# Patient Record
Sex: Female | Born: 1985 | Race: White | Hispanic: No | Marital: Married | Smoking: Never smoker
Health system: Southern US, Community
[De-identification: ages and names within clinical notes are randomized; demographics above are authoritative.]

## PROBLEM LIST (undated history)

## (undated) DIAGNOSIS — F411 Generalized anxiety disorder: Secondary | ICD-10-CM

## (undated) DIAGNOSIS — E039 Hypothyroidism, unspecified: Secondary | ICD-10-CM

## (undated) DIAGNOSIS — G43829 Menstrual migraine, not intractable, without status migrainosus: Secondary | ICD-10-CM

## (undated) DIAGNOSIS — M797 Fibromyalgia: Secondary | ICD-10-CM

## (undated) DIAGNOSIS — R51 Headache: Secondary | ICD-10-CM

## (undated) DIAGNOSIS — Z309 Encounter for contraceptive management, unspecified: Secondary | ICD-10-CM

## (undated) HISTORY — DX: Encounter for contraceptive management, unspecified: Z30.9

## (undated) HISTORY — DX: Menstrual migraine, not intractable, without status migrainosus: G43.829

## (undated) HISTORY — DX: Hypothyroidism, unspecified: E03.9

## (undated) HISTORY — DX: Headache: R51

## (undated) HISTORY — PX: WISDOM TOOTH EXTRACTION: SHX21

## (undated) HISTORY — DX: Fibromyalgia: M79.7

## (undated) HISTORY — DX: Generalized anxiety disorder: F41.1

## (undated) HISTORY — PX: OTHER SURGICAL HISTORY: SHX169

---
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
## 2005-05-28 IMAGING — NM NM HEPATO W/GB/PHARM/[PERSON_NAME]
2 series · 12 of 12 positions shown · non-contrast
Comparison: none

CLINICAL DATA: Abdominal pain.
 HEPATOBILIARY SCAN WITH EJECTION FRACTION 
 Hepatobiliary imaging performed following 5 mCi Zc-ZZm mebrofenin.  Prompt tracer extraction from blood stream, indicating normal hepatocellular function.  Prompt excretion into the biliary tree with visualization of the gallbladder by 15 minutes and small bowel by 30 minutes.  No hepatic retention of tracer.  Progressive filling of gallbladder over one hour.
 At one hour, the patient ingested Half and Half and imaging was continued for 60 minutes.  Prompt emptying of tracer occurs from the gallbladder following fatty meal stimulation.  Calculated ejection fraction is 93% which is normal.
 IMPRESSION
 Normal exam.

[Series 1: hepatobiliary · 3.20mm/px · 6 of 60 frames shown (1 of 2)]
[frame 6/60]
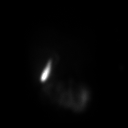
[frame 16/60]
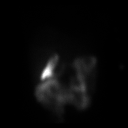
[frame 26/60]
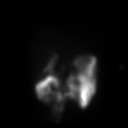
[frame 36/60]
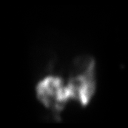
[frame 46/60]
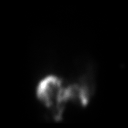
[frame 56/60]
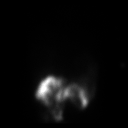

[Series 1: hepatobiliary · 3.20mm/px · 6 of 60 frames shown (2 of 2)]
[frame 6/60]
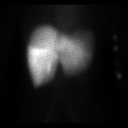
[frame 16/60]
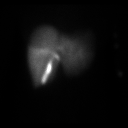
[frame 26/60]
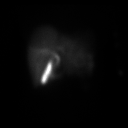
[frame 36/60]
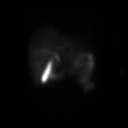
[frame 46/60]
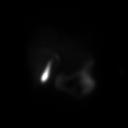
[frame 56/60]
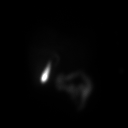

[12 of 12 positions shown; findings below may reference images not displayed]

## 2007-07-24 ENCOUNTER — Other Ambulatory Visit: Admission: RE | Admit: 2007-07-24 | Discharge: 2007-07-24 | Payer: Self-pay | Admitting: Obstetrics and Gynecology

## 2008-08-26 ENCOUNTER — Other Ambulatory Visit: Admission: RE | Admit: 2008-08-26 | Discharge: 2008-08-26 | Payer: Self-pay | Admitting: Obstetrics and Gynecology

## 2009-02-14 ENCOUNTER — Emergency Department (HOSPITAL_COMMUNITY): Admission: EM | Admit: 2009-02-14 | Discharge: 2009-02-14 | Payer: Self-pay | Admitting: Emergency Medicine

## 2009-10-08 ENCOUNTER — Other Ambulatory Visit: Admission: RE | Admit: 2009-10-08 | Discharge: 2009-10-08 | Payer: Self-pay | Admitting: Obstetrics and Gynecology

## 2010-11-08 ENCOUNTER — Encounter: Payer: Self-pay | Admitting: Pulmonary Disease

## 2011-02-01 ENCOUNTER — Other Ambulatory Visit (HOSPITAL_COMMUNITY)
Admission: RE | Admit: 2011-02-01 | Discharge: 2011-02-01 | Disposition: A | Discharge status: Home / Self Care | Payer: BC Managed Care – PPO | Source: Ambulatory Visit | Attending: Obstetrics and Gynecology | Admitting: Obstetrics and Gynecology

## 2011-02-01 DIAGNOSIS — Z113 Encounter for screening for infections with a predominantly sexual mode of transmission: Secondary | ICD-10-CM | POA: Insufficient documentation

## 2011-02-01 DIAGNOSIS — Z01419 Encounter for gynecological examination (general) (routine) without abnormal findings: Secondary | ICD-10-CM | POA: Insufficient documentation

## 2011-03-05 NOTE — Group Therapy Note (Signed)
   NAME:  Erica Farley, Erica Farley                      ACCOUNT NO.:  [id number]   MEDICAL RECORD NO.:  [id number]                   PATIENT TYPE:  OBV   LOCATION:  A316                                 FACILITY:  [facility]   PHYSICIAN:  Edward L. Juanetta Gosling, M.D.             DATE OF BIRTH:  [birth year]   DATE OF PROCEDURE:  01/29/2003  DATE OF DISCHARGE:                                   PROGRESS NOTE   PROBLEM:  Nausea, vomiting, and diarrhea.   SUBJECTIVE:  The patient says she is feeling much better this morning.  She  has no new complaints.   OBJECTIVE:  Her white count is down to the 6000 range.  Her abdomen is soft.  She has no tenderness.   ASSESSMENT:  She is much improved.   PLAN:  Discharge today; please see Discharge Summary for details.                                               Edward L. Juanetta Gosling, M.D.    ELH/MEDQ  D:  01/29/2003  T:  01/29/2003  Job:  [id number]

## 2011-03-05 NOTE — H&P (Signed)
NAME:  Erica Farley, Erica Farley                      ACCOUNT NO.:  [id number]   MEDICAL RECORD NO.:  [id number]                   PATIENT TYPE:  EMS   LOCATION:  ED                                   FACILITY:  [facility]   PHYSICIAN:  Edward L. Juanetta Gosling, M.D.             DATE OF BIRTH:  1986-01-09   DATE OF ADMISSION:  01/28/2003  DATE OF DISCHARGE:                                HISTORY & PHYSICAL   CHIEF COMPLAINT:  Nausea, vomiting and diarrhea.   HISTORY OF PRESENT ILLNESS:  The patient awoke about midnight on the morning  of admission with nausea and vomiting.  She vomited on several occasions  with fairly marked diarrhea as well and eventually came to the emergency  room for evaluation.  In the emergency room, she was felt to be somewhat  dehydrated and was found to have a white blood cell count of 19,000.  She  underwent a number of studies, none of which showed a definite abnormality  that explained her problem.  She did have a CT scan and although her  appendix was note definitely seen, there was no evidence of inflammation.   PAST MEDICAL HISTORY:  Negative.   MEDICATIONS:  None.   ALLERGIES:  No known drug allergies.   FAMILY HISTORY:  Her father has had subacute bacterial endocarditis.  Mother  has had anxiety.  Otherwise, negative family history in general.   SOCIAL HISTORY:  She is a Consulting civil engineer.  She does not smoke or drink alcohol.  Her pregnancy test was negative.   PHYSICAL EXAMINATION:  GENERAL:  She says that she is feeling better and is  no longer having nausea or vomiting.  ABDOMEN:  Nontender.  Bowel sounds are slightly hyperactive.  HEENT:  Mucous membranes are slightly dry.  Nose and throat are clear.  NECK:  Supple without mass or jugular venous distention.   LABORATORY DATA AND X-RAY FINDINGS:  Lab work shows the elevated white blood  cell count, but otherwise essentially negative including her CT of abdomen.    PLAN:  Although she is feeling better now, she  was fairly sick with this and  I am going to go ahead and have her in observation overnight to make certain  she clears.  I am not going to plan any other new treatments right now.  We  will see if we can clear her up and make sure she is able to manage fluids,  feeding, etc.  She will receive IV fluids and have medications for nausea,  vomiting, etc. ordered.                                                Edward L. Juanetta Gosling, M.D.    ELH/MEDQ  D:  01/28/2003  T:  01/28/2003  Job:  [id number]  

## 2011-03-05 NOTE — Discharge Summary (Signed)
   NAME:  Erica Farley, Erica Farley                      ACCOUNT NO.:  [id number]   MEDICAL RECORD NO.:  [id number]                   PATIENT TYPE:  OBV   LOCATION:  A316                                 FACILITY:  [facility]   PHYSICIAN:  Edward L. Juanetta Gosling, M.D.             DATE OF BIRTH:  1986-06-04   DATE OF ADMISSION:  01/28/2003  DATE OF DISCHARGE:                                 DISCHARGE SUMMARY   FINAL DIAGNOSES:  1. Acute gastroenteritis.  2. Dehydration secondary to #1.   HISTORY OF PRESENT ILLNESS:  The patient is a 25 year old who developed  nausea, vomiting and diarrhea about 24 hours prior to admission.  She came  to the hospital and was evaluated completing a CT of the abdomen because of  an elevated white blood cell count with no definite etiology being found.  Her examination on admission showed that her chest was relatively clear  without wheezes.  Heart was regular.  Abdomen was minimally tender.  Her  white blood cell count, as mentioned, was 19,000.  The rest of her lab work  was normal.  CT was normal.   HOSPITAL COURSE:  She was brought in for hospital observation, given IV  fluids and no other medications.  She improved, was able to eat and keep her  food down and was discharged home the next day on Phenergan 25 mg p.o. q.6h.  p.r.n. nausea and vomiting.  Imodium as needed for diarrhea.  She is going  to be out of school today.  She was given a note for school for 4/12-13 and  return to school on 4/14.                                               Edward L. Juanetta Gosling, M.D.    ELH/MEDQ  D:  01/29/2003  T:  01/29/2003  Job:  [id number]

## 2012-02-04 ENCOUNTER — Other Ambulatory Visit: Payer: Self-pay | Admitting: Obstetrics and Gynecology

## 2012-04-05 LAB — OB RESULTS CONSOLE RPR: RPR: NONREACTIVE

## 2012-04-05 LAB — OB RESULTS CONSOLE ANTIBODY SCREEN: Antibody Screen: NEGATIVE

## 2012-04-05 LAB — OB RESULTS CONSOLE ABO/RH: RH Type: POSITIVE

## 2012-04-05 LAB — OB RESULTS CONSOLE RUBELLA ANTIBODY, IGM: Rubella: IMMUNE

## 2012-04-05 LAB — OB RESULTS CONSOLE HEPATITIS B SURFACE ANTIGEN: Hepatitis B Surface Ag: NEGATIVE

## 2012-04-05 LAB — OB RESULTS CONSOLE HIV ANTIBODY (ROUTINE TESTING): HIV: NONREACTIVE

## 2012-11-13 ENCOUNTER — Encounter (HOSPITAL_COMMUNITY): Payer: Self-pay | Admitting: Pharmacist

## 2012-11-13 ENCOUNTER — Encounter (HOSPITAL_COMMUNITY): Payer: Self-pay

## 2012-11-14 ENCOUNTER — Encounter (HOSPITAL_COMMUNITY): Payer: Self-pay

## 2012-11-14 ENCOUNTER — Encounter (HOSPITAL_COMMUNITY)
Admission: RE | Admit: 2012-11-14 | Discharge: 2012-11-14 | Disposition: A | Discharge status: Home / Self Care | Payer: BC Managed Care – PPO | Source: Ambulatory Visit | Attending: Obstetrics & Gynecology | Admitting: Obstetrics & Gynecology

## 2012-11-14 LAB — SURGICAL PCR SCREEN
MRSA, PCR: NEGATIVE
Staphylococcus aureus: NEGATIVE

## 2012-11-14 LAB — RPR: RPR Ser Ql: NONREACTIVE

## 2012-11-14 LAB — CBC
HCT: 41.2 % (ref 36.0–46.0)
Hemoglobin: 13.8 g/dL (ref 12.0–15.0)
MCH: 32.4 pg (ref 26.0–34.0)
MCHC: 33.5 g/dL (ref 30.0–36.0)
MCV: 96.7 fL (ref 78.0–100.0)
Platelets: 195 10*3/uL (ref 150–400)
RBC: 4.26 MIL/uL (ref 3.87–5.11)
RDW: 15.5 % (ref 11.5–15.5)
WBC: 11.4 10*3/uL — ABNORMAL HIGH (ref 4.0–10.5)

## 2012-11-14 NOTE — Patient Instructions (Signed)
Your procedure is scheduled on:11/16/12  Enter through the Main Entrance at :1130 am  Pick up desk phone and dial [phone] and inform us of your arrival.  Please call [phone] if you have any problems the morning of surgery.  Remember: Do not eat after midnight:WED  Clear liquids ok until 9am Thursday Take these meds the morning of surgery with a sip of water: none  DO NOT wear jewelry, eye make-up, lipstick,body lotion, or dark fingernail polish. Do not shave for 48 hours prior to surgery.  If you are to be admitted after surgery, leave suitcase in car until your room has been assigned. Patients discharged on the day of surgery will not be allowed to drive home.

## 2012-11-16 ENCOUNTER — Encounter (HOSPITAL_COMMUNITY): Payer: Self-pay | Admitting: Anesthesiology

## 2012-11-16 ENCOUNTER — Inpatient Hospital Stay (HOSPITAL_COMMUNITY): Payer: BC Managed Care – PPO | Admitting: Anesthesiology

## 2012-11-16 ENCOUNTER — Other Ambulatory Visit: Payer: Self-pay | Admitting: Obstetrics & Gynecology

## 2012-11-16 ENCOUNTER — Encounter (HOSPITAL_COMMUNITY): Payer: Self-pay | Admitting: *Deleted

## 2012-11-16 ENCOUNTER — Inpatient Hospital Stay (HOSPITAL_COMMUNITY)
Admission: RE | Admit: 2012-11-16 | Discharge: 2012-11-18 | DRG: 371 | Disposition: A | Discharge status: Home / Self Care | Payer: BC Managed Care – PPO | Source: Ambulatory Visit | Attending: Obstetrics & Gynecology | Admitting: Obstetrics & Gynecology

## 2012-11-16 ENCOUNTER — Encounter (HOSPITAL_COMMUNITY)
Admission: RE | Disposition: A | Discharge status: Home / Self Care | Payer: Self-pay | Source: Ambulatory Visit | Attending: Obstetrics & Gynecology

## 2012-11-16 DIAGNOSIS — O329XX0 Maternal care for malpresentation of fetus, unspecified, not applicable or unspecified: Secondary | ICD-10-CM

## 2012-11-16 DIAGNOSIS — Z01818 Encounter for other preprocedural examination: Secondary | ICD-10-CM

## 2012-11-16 DIAGNOSIS — Z01812 Encounter for preprocedural laboratory examination: Secondary | ICD-10-CM

## 2012-11-16 DIAGNOSIS — O321XX0 Maternal care for breech presentation, not applicable or unspecified: Principal | ICD-10-CM | POA: Diagnosis present

## 2012-11-16 LAB — ABO/RH: ABO/RH(D): A POS

## 2012-11-16 LAB — TYPE AND SCREEN
ABO/RH(D): A POS
Antibody Screen: NEGATIVE

## 2012-11-16 SURGERY — Surgical Case
Anesthesia: Spinal | Site: Abdomen | Wound class: Clean Contaminated

## 2012-11-16 MED ORDER — NALOXONE HCL 0.4 MG/ML IJ SOLN: 0.4000 mg | INTRAMUSCULAR | Status: DC | PRN

## 2012-11-16 MED ORDER — DIPHENHYDRAMINE HCL 25 MG PO CAPS
25.0000 mg | ORAL_CAPSULE | Freq: Four times a day (QID) | ORAL | Status: DC | PRN
  Filled 2012-11-16: qty 1

## 2012-11-16 MED ORDER — EPHEDRINE SULFATE 50 MG/ML IJ SOLN
INTRAMUSCULAR | Status: DC | PRN
  Administered 2012-11-16 (×7): 10 mg via INTRAVENOUS

## 2012-11-16 MED ORDER — NALBUPHINE HCL 10 MG/ML IJ SOLN: 5.0000 mg | INTRAMUSCULAR | Status: DC | PRN

## 2012-11-16 MED ORDER — SIMETHICONE 80 MG PO CHEW: 80.0000 mg | CHEWABLE_TABLET | ORAL | Status: DC | PRN

## 2012-11-16 MED ORDER — DIBUCAINE 1 % RE OINT: 1.0000 "application " | TOPICAL_OINTMENT | RECTAL | Status: DC | PRN

## 2012-11-16 MED ORDER — ONDANSETRON HCL 4 MG/2ML IJ SOLN
INTRAMUSCULAR | Status: AC
  Filled 2012-11-16: qty 2

## 2012-11-16 MED ORDER — LACTATED RINGERS IV SOLN
INTRAVENOUS | Status: DC | PRN
  Administered 2012-11-16: 14:00:00 via INTRAVENOUS

## 2012-11-16 MED ORDER — OXYTOCIN 40 UNITS IN LACTATED RINGERS INFUSION - SIMPLE MED: 62.5000 mL/h | INTRAVENOUS | Status: AC

## 2012-11-16 MED ORDER — ONDANSETRON HCL 4 MG PO TABS: 4.0000 mg | ORAL_TABLET | ORAL | Status: DC | PRN

## 2012-11-16 MED ORDER — SODIUM CHLORIDE 0.9 % IJ SOLN: 3.0000 mL | INTRAMUSCULAR | Status: DC | PRN

## 2012-11-16 MED ORDER — PRENATAL MULTIVITAMIN CH
1.0000 | ORAL_TABLET | Freq: Every day | ORAL | Status: DC
  Administered 2012-11-17: 1 via ORAL
  Filled 2012-11-16 (×2): qty 1

## 2012-11-16 MED ORDER — LACTATED RINGERS IV SOLN
INTRAVENOUS | Status: DC
  Administered 2012-11-17 (×2): via INTRAVENOUS

## 2012-11-16 MED ORDER — ONDANSETRON HCL 4 MG/2ML IJ SOLN
INTRAMUSCULAR | Status: DC | PRN
  Administered 2012-11-16: 4 mg via INTRAVENOUS

## 2012-11-16 MED ORDER — MORPHINE SULFATE 0.5 MG/ML IJ SOLN
INTRAMUSCULAR | Status: AC
  Filled 2012-11-16: qty 10

## 2012-11-16 MED ORDER — KETOROLAC TROMETHAMINE 30 MG/ML IJ SOLN: 30.0000 mg | Freq: Four times a day (QID) | INTRAMUSCULAR | Status: AC | PRN

## 2012-11-16 MED ORDER — LANOLIN HYDROUS EX OINT: 1.0000 "application " | TOPICAL_OINTMENT | CUTANEOUS | Status: DC | PRN

## 2012-11-16 MED ORDER — MORPHINE SULFATE (PF) 0.5 MG/ML IJ SOLN
INTRAMUSCULAR | Status: DC | PRN
  Administered 2012-11-16: .15 mg via INTRATHECAL

## 2012-11-16 MED ORDER — WITCH HAZEL-GLYCERIN EX PADS: 1.0000 "application " | MEDICATED_PAD | CUTANEOUS | Status: DC | PRN

## 2012-11-16 MED ORDER — ONDANSETRON HCL 4 MG/2ML IJ SOLN: 4.0000 mg | INTRAMUSCULAR | Status: DC | PRN

## 2012-11-16 MED ORDER — DIPHENHYDRAMINE HCL 25 MG PO CAPS
25.0000 mg | ORAL_CAPSULE | ORAL | Status: DC | PRN
  Administered 2012-11-17: 25 mg via ORAL

## 2012-11-16 MED ORDER — FENTANYL CITRATE 0.05 MG/ML IJ SOLN: 25.0000 ug | INTRAMUSCULAR | Status: DC | PRN

## 2012-11-16 MED ORDER — IBUPROFEN 600 MG PO TABS: 600.0000 mg | ORAL_TABLET | Freq: Four times a day (QID) | ORAL | Status: DC | PRN

## 2012-11-16 MED ORDER — BUPIVACAINE LIPOSOME 1.3 % IJ SUSP
Freq: Once | INTRAMUSCULAR | Status: DC
  Filled 2012-11-16: qty 20

## 2012-11-16 MED ORDER — FENTANYL CITRATE 0.05 MG/ML IJ SOLN
INTRAMUSCULAR | Status: AC
  Filled 2012-11-16: qty 2

## 2012-11-16 MED ORDER — LACTATED RINGERS IV SOLN
Freq: Once | INTRAVENOUS | Status: AC
  Administered 2012-11-16: 12:00:00 via INTRAVENOUS

## 2012-11-16 MED ORDER — MENTHOL 3 MG MT LOZG: 1.0000 | LOZENGE | OROMUCOSAL | Status: DC | PRN

## 2012-11-16 MED ORDER — PHENYLEPHRINE HCL 10 MG/ML IJ SOLN
INTRAMUSCULAR | Status: DC | PRN
  Administered 2012-11-16 (×2): 80 ug via INTRAVENOUS
  Administered 2012-11-16: 40 ug via INTRAVENOUS
  Administered 2012-11-16: 80 ug via INTRAVENOUS

## 2012-11-16 MED ORDER — TETANUS-DIPHTH-ACELL PERTUSSIS 5-2.5-18.5 LF-MCG/0.5 IM SUSP
0.5000 mL | Freq: Once | INTRAMUSCULAR | Status: AC
  Administered 2012-11-17: 0.5 mL via INTRAMUSCULAR
  Filled 2012-11-16: qty 0.5

## 2012-11-16 MED ORDER — CEFAZOLIN SODIUM-DEXTROSE 2-3 GM-% IV SOLR
2.0000 g | INTRAVENOUS | Status: AC
  Administered 2012-11-16: 2 g via INTRAVENOUS

## 2012-11-16 MED ORDER — PHENYLEPHRINE 40 MCG/ML (10ML) SYRINGE FOR IV PUSH (FOR BLOOD PRESSURE SUPPORT)
PREFILLED_SYRINGE | INTRAVENOUS | Status: AC
  Filled 2012-11-16: qty 5

## 2012-11-16 MED ORDER — SENNOSIDES-DOCUSATE SODIUM 8.6-50 MG PO TABS
2.0000 | ORAL_TABLET | Freq: Every day | ORAL | Status: DC
  Administered 2012-11-16 – 2012-11-17 (×2): 2 via ORAL

## 2012-11-16 MED ORDER — FLUOXETINE HCL 20 MG PO TABS
10.0000 mg | ORAL_TABLET | Freq: Every day | ORAL | Status: DC
  Administered 2012-11-17: 10 mg via ORAL
  Filled 2012-11-16 (×2): qty 1

## 2012-11-16 MED ORDER — SIMETHICONE 80 MG PO CHEW
80.0000 mg | CHEWABLE_TABLET | Freq: Three times a day (TID) | ORAL | Status: DC
  Administered 2012-11-16 – 2012-11-17 (×5): 80 mg via ORAL

## 2012-11-16 MED ORDER — OXYTOCIN 10 UNIT/ML IJ SOLN
40.0000 [IU] | INTRAVENOUS | Status: DC | PRN
  Administered 2012-11-16: 40 [IU] via INTRAVENOUS

## 2012-11-16 MED ORDER — SODIUM CHLORIDE 0.9 % IJ SOLN
INTRAMUSCULAR | Status: DC | PRN
  Administered 2012-11-16: 40 mL

## 2012-11-16 MED ORDER — CEFAZOLIN SODIUM-DEXTROSE 2-3 GM-% IV SOLR
INTRAVENOUS | Status: AC
  Filled 2012-11-16: qty 50

## 2012-11-16 MED ORDER — OXYTOCIN 10 UNIT/ML IJ SOLN
INTRAMUSCULAR | Status: AC
  Filled 2012-11-16: qty 4

## 2012-11-16 MED ORDER — SCOPOLAMINE 1 MG/3DAYS TD PT72
MEDICATED_PATCH | TRANSDERMAL | Status: AC
  Administered 2012-11-16: 1.5 mg via TRANSDERMAL
  Filled 2012-11-16: qty 1

## 2012-11-16 MED ORDER — 0.9 % SODIUM CHLORIDE (POUR BTL) OPTIME
TOPICAL | Status: DC | PRN
  Administered 2012-11-16: 1000 mL

## 2012-11-16 MED ORDER — NALOXONE HCL 1 MG/ML IJ SOLN: 1.0000 ug/kg/h | INTRAVENOUS | Status: DC | PRN

## 2012-11-16 MED ORDER — METOCLOPRAMIDE HCL 5 MG/ML IJ SOLN: 10.0000 mg | Freq: Three times a day (TID) | INTRAMUSCULAR | Status: DC | PRN

## 2012-11-16 MED ORDER — FENTANYL CITRATE 0.05 MG/ML IJ SOLN
INTRAMUSCULAR | Status: DC | PRN
  Administered 2012-11-16: 25 ug via INTRATHECAL

## 2012-11-16 MED ORDER — KETOROLAC TROMETHAMINE 30 MG/ML IJ SOLN
30.0000 mg | Freq: Four times a day (QID) | INTRAMUSCULAR | Status: AC | PRN
  Administered 2012-11-16: 30 mg via INTRAVENOUS

## 2012-11-16 MED ORDER — SCOPOLAMINE 1 MG/3DAYS TD PT72
1.0000 | MEDICATED_PATCH | Freq: Once | TRANSDERMAL | Status: DC
  Administered 2012-11-16: 1.5 mg via TRANSDERMAL

## 2012-11-16 MED ORDER — BUPIVACAINE LIPOSOME 1.3 % IJ SUSP
20.0000 mL | Freq: Once | INTRAMUSCULAR | Status: AC
  Administered 2012-11-16: 20 mL
  Filled 2012-11-16: qty 20

## 2012-11-16 MED ORDER — ZOLPIDEM TARTRATE 5 MG PO TABS: 5.0000 mg | ORAL_TABLET | Freq: Every evening | ORAL | Status: DC | PRN

## 2012-11-16 MED ORDER — MEPERIDINE HCL 25 MG/ML IJ SOLN: 6.2500 mg | INTRAMUSCULAR | Status: DC | PRN

## 2012-11-16 MED ORDER — IBUPROFEN 600 MG PO TABS
600.0000 mg | ORAL_TABLET | Freq: Four times a day (QID) | ORAL | Status: DC
  Administered 2012-11-16 – 2012-11-18 (×6): 600 mg via ORAL
  Filled 2012-11-16 (×6): qty 1

## 2012-11-16 MED ORDER — LACTATED RINGERS IV SOLN
INTRAVENOUS | Status: DC | PRN
  Administered 2012-11-16 (×3): via INTRAVENOUS

## 2012-11-16 MED ORDER — DIPHENHYDRAMINE HCL 50 MG/ML IJ SOLN: 25.0000 mg | INTRAMUSCULAR | Status: DC | PRN

## 2012-11-16 MED ORDER — OXYCODONE-ACETAMINOPHEN 5-325 MG PO TABS
1.0000 | ORAL_TABLET | ORAL | Status: DC | PRN
  Administered 2012-11-17 – 2012-11-18 (×3): 1 via ORAL
  Filled 2012-11-16 (×3): qty 1

## 2012-11-16 MED ORDER — DIPHENHYDRAMINE HCL 50 MG/ML IJ SOLN: 12.5000 mg | INTRAMUSCULAR | Status: DC | PRN

## 2012-11-16 MED ORDER — BUPIVACAINE IN DEXTROSE 0.75-8.25 % IT SOLN
INTRATHECAL | Status: DC | PRN
  Administered 2012-11-16: 1.6 mL via INTRATHECAL

## 2012-11-16 MED ORDER — KETOROLAC TROMETHAMINE 30 MG/ML IJ SOLN
INTRAMUSCULAR | Status: AC
  Administered 2012-11-16: 30 mg via INTRAVENOUS
  Filled 2012-11-16: qty 1

## 2012-11-16 MED ORDER — ONDANSETRON HCL 4 MG/2ML IJ SOLN: 4.0000 mg | Freq: Three times a day (TID) | INTRAMUSCULAR | Status: DC | PRN

## 2012-11-16 MED ORDER — ROPIVACAINE HCL 5 MG/ML IJ SOLN
INTRAMUSCULAR | Status: AC
  Filled 2012-11-16: qty 30

## 2012-11-16 SURGICAL SUPPLY — 34 items
CLOTH BEACON ORANGE TIMEOUT ST (SAFETY) ×2 IMPLANT
DERMABOND ADVANCED (GAUZE/BANDAGES/DRESSINGS) ×2
DERMABOND ADVANCED .7 DNX12 (GAUZE/BANDAGES/DRESSINGS) ×2 IMPLANT
DRAPE LG THREE QUARTER DISP (DRAPES) ×2 IMPLANT
DRSG OPSITE POSTOP 4X10 (GAUZE/BANDAGES/DRESSINGS) ×2 IMPLANT
DURAPREP 26ML APPLICATOR (WOUND CARE) ×6 IMPLANT
ELECT REM PT RETURN 9FT ADLT (ELECTROSURGICAL) ×2
ELECTRODE REM PT RTRN 9FT ADLT (ELECTROSURGICAL) ×1 IMPLANT
EXTRACTOR VACUUM BELL STYLE (SUCTIONS) IMPLANT
GLOVE BIOGEL PI IND STRL 8 (GLOVE) ×2 IMPLANT
GLOVE BIOGEL PI INDICATOR 8 (GLOVE) ×2
GLOVE ECLIPSE 8.0 STRL XLNG CF (GLOVE) ×2 IMPLANT
KIT ABG SYR 3ML LUER SLIP (SYRINGE) ×2 IMPLANT
NEEDLE HYPO 25X5/8 SAFETYGLIDE (NEEDLE) ×2 IMPLANT
NEEDLE SPNL 22GX3.5 QUINCKE BK (NEEDLE) ×2 IMPLANT
NS IRRIG 1000ML POUR BTL (IV SOLUTION) ×2 IMPLANT
PACK C SECTION WH (CUSTOM PROCEDURE TRAY) ×2 IMPLANT
PAD OB MATERNITY 4.3X12.25 (PERSONAL CARE ITEMS) ×2 IMPLANT
RTRCTR C-SECT PINK 25CM LRG (MISCELLANEOUS) IMPLANT
SLEEVE SCD COMPRESS KNEE MED (MISCELLANEOUS) IMPLANT
STAPLER VISISTAT 35W (STAPLE) IMPLANT
SUT CHROMIC 0 CT 1 (SUTURE) ×2 IMPLANT
SUT MNCRL 0 VIOLET CTX 36 (SUTURE) ×2 IMPLANT
SUT MONOCRYL 0 CTX 36 (SUTURE) ×2
SUT PLAIN 2 0 (SUTURE)
SUT PLAIN 2 0 XLH (SUTURE) IMPLANT
SUT PLAIN ABS 2-0 CT1 27XMFL (SUTURE) IMPLANT
SUT VIC AB 0 CTX 36 (SUTURE) ×1
SUT VIC AB 0 CTX36XBRD ANBCTRL (SUTURE) ×1 IMPLANT
SUT VIC AB 4-0 KS 27 (SUTURE) IMPLANT
SYR CONTROL 10ML LL (SYRINGE) ×2 IMPLANT
TOWEL OR 17X24 6PK STRL BLUE (TOWEL DISPOSABLE) ×6 IMPLANT
TRAY FOLEY CATH 14FR (SET/KITS/TRAYS/PACK) IMPLANT
WATER STERILE IRR 1000ML POUR (IV SOLUTION) ×2 IMPLANT

## 2012-11-16 NOTE — Op Note (Signed)
Cesarean Section Procedure Note   DANEEN VOLCY   11/16/2012   Indications: Breech Presentation   Pre-operative Diagnosis: IUP at [redacted]w[redacted]d with breech presentation  Post-operative Diagnosis: Same   Procedure:  Primary low transverse cesarean section  Surgeon:  Duane Lope, MD  Assistants: Napoleon Form, MD  Anesthesia: spinal    Estimated Blood Loss: 650  Total IV Fluids: 3500 ml   Urine Output: 150 CC OF clear urine  Specimens: placenta to L&D   Findings:  Baby condition / location:  Viable female infant, weighing 8 lb, 10.3 oz. APGARs 8, 9.  Sent to nursery in stable condition.  Complications: none  Indications: REMA LIEVANOS is a 27 y.o. G1P0 with an IUP at [redacted]w[redacted]d presenting for primary low transverse cesarean section for breech presentation  The risks, benefits, complications, treatment options, and expected outcomes were discussed with the patient . The patient concurred with the proposed plan, giving informed consent, identified as ARYAM ZHAN and the procedure verified as C-Section Delivery.  Procedure Details: A Time Out was held and the above information confirmed. Preoperatively, the patient received 2 grams of Ancef and had SCDs placed to her lower extremities.  Spinal anesthesia was initiated and found to be adequate. A foley catheter placed in the patient's bladder and attached to constant gravity. After induction of anesthesia, the patient was draped and prepped in the usual sterile manner and placed in a dorsal supine position with a leftward tilt. A Pfannenstiel incision was made with a scalpel and carried down through the subcutaneous tissue to the fascia. Fascial incision was made and extended transversely with Mayo scissors. The fascia was separated from the underlying rectus tissue superiorly and inferiorly. The peritoneum was identified and entered bluntly. A bladder blade was placed, and a low transverse uterine incision was made. The infant was  delivered from breech presentation (female, 3920 grams with APGARs 8 at one minute and 9 at 5 minutes) . Nose and mouth were suctioned. The cord was clamped and cut, and the infant was handed off to the awaiting neonatology team. Cord pH and cord blood were sent for evaluation. The placenta was removed intact and appeared normal in appearance.  The uterine outline, tubes and ovaries appeared normal}. The uterine incision was closed with running locked sutures of 0 Monocryl, and a second imbricating layer was also placed with the same suture. Hemostasis was observed. Lavage was carried out until clear. The muscles and peritoneum were reapproximated with 2-0 plain suture with interrupted stitches. The fascia was then reapproximated with running sutures of 0 Vicryl. 20 ml (266 mg) of 1.3% Bupivacaine Liposome mixed with 40 ml of normal saline was injected into the sub-fascial and subcutaneous tissue. The skin was closed with 4-0 Vicryl in running subcuticular fashion. Dermabond was applied to the incision followed by a honey-comb dressing. Instrument, sponge, and needle counts were correct prior the abdominal closure and were correct at the conclusion of the case. The foley catheter drained clear urine throughout the procedure.   Disposition: PACU - hemodynamically stable.   Maternal Condition: stable    Signed: Napoleon Form, MD 11/16/2012 5:49 PM

## 2012-11-16 NOTE — Anesthesia Procedure Notes (Signed)
Spinal  Patient location during procedure: OR Start time: 11/16/2012 1:15 PM Staffing Anesthesiologist: Aidyn Sportsman A. Performed by: anesthesiologist  Preanesthetic Checklist Completed: patient identified, site marked, surgical consent, pre-op evaluation, timeout performed, IV checked, risks and benefits discussed and monitors and equipment checked Spinal Block Patient position: sitting Prep: site prepped and draped and DuraPrep Patient monitoring: heart rate, cardiac monitor, continuous pulse ox and blood pressure Approach: midline Location: L3-4 Injection technique: single-shot Needle Needle type: Sprotte  Needle gauge: 24 G Needle length: 9 cm Needle insertion depth: 5 cm Assessment Sensory level: T4 Events: paresthesia Additional Notes Patient tolerated procedure well. Transient paresthesia right leg. Adequate sensory level.

## 2012-11-16 NOTE — Anesthesia Preprocedure Evaluation (Signed)
Anesthesia Evaluation  Patient identified by MRN, date of birth, ID band Patient awake    Reviewed: Allergy & Precautions, H&P , NPO status , Patient's Chart, lab work & pertinent test results  Airway Mallampati: III TM Distance: >3 FB Neck ROM: Full    Dental No notable dental hx. (+) Teeth Intact   Pulmonary neg pulmonary ROS,  breath sounds clear to auscultation  Pulmonary exam normal       Cardiovascular negative cardio ROS  Rhythm:Regular Rate:Normal     Neuro/Psych  Headaches, Anxiety    GI/Hepatic negative GI ROS, Neg liver ROS,   Endo/Other  negative endocrine ROS  Renal/GU negative Renal ROS  negative genitourinary   Musculoskeletal negative musculoskeletal ROS (+)   Abdominal   Peds  Hematology negative hematology ROS (+)   Anesthesia Other Findings   Reproductive/Obstetrics (+) Pregnancy                           Anesthesia Physical Anesthesia Plan  ASA: II  Anesthesia Plan: Spinal   Post-op Pain Management:    Induction:   Airway Management Planned: Natural Airway  Additional Equipment:   Intra-op Plan:   Post-operative Plan:   Informed Consent: I have reviewed the patients History and Physical, chart, labs and discussed the procedure including the risks, benefits and alternatives for the proposed anesthesia with the patient or authorized representative who has indicated his/her understanding and acceptance.   Dental advisory given  Plan Discussed with: CRNA, Anesthesiologist and Surgeon  Anesthesia Plan Comments:         Anesthesia Quick Evaluation

## 2012-11-16 NOTE — H&P (Signed)
Erica Farley is a 27 y.o. female presenting for primary Caesarean section.  She is 39 2/[redacted] weeks gestation by LMP and early sonogram.  The fetus is in the breech presentatio9n and the patient declined an external cephalic version.   History OB History    Grav Para Term Preterm Abortions TAB SAB Ect Mult Living   1         0     Past Medical History  Diagnosis Date  . Anxiety   . 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 Bladder stem     Family History: family history is not on file. Social History:  reports that she has never smoked. She does not have any smokeless tobacco history on file. She reports that she does not drink alcohol or use illicit drugs.   Prenatal Transfer Tool  Maternal Diabetes: No Genetic Screening: Normal Maternal Ultrasounds/Referrals: Normal Fetal Ultrasounds or other Referrals:  None Maternal Substance Abuse:  No Significant Maternal Medications:  None Significant Maternal Lab Results:  None Other Comments:  None  ROS  Review of Systems  Constitutional: Negative for fever, chills, weight loss, malaise/fatigue and diaphoresis.  HENT: Negative for hearing loss, ear pain, nosebleeds, congestion, sore throat, neck pain, tinnitus and ear discharge.   Eyes: Negative for blurred vision, double vision, photophobia, pain, discharge and redness.  Respiratory: Negative for cough, hemoptysis, sputum production, shortness of breath, wheezing and stridor.   Cardiovascular: Negative for chest pain, palpitations, orthopnea, claudication, leg swelling and PND.  Gastrointestinal: Negative for abdominal pain. Negative for heartburn, nausea, vomiting, diarrhea, constipation, blood in stool and melena.  Genitourinary: Negative for dysuria, urgency, frequency, hematuria and flank pain.  Musculoskeletal: Negative for myalgias, back pain, joint pain and falls.  Skin: Negative for itching and rash.  Neurological: Negative for  dizziness, tingling, tremors, sensory change, speech change, focal weakness, seizures, loss of consciousness, weakness and headaches.  Endo/Heme/Allergies: Negative for environmental allergies and polydipsia. Does not bruise/bleed easily.  Psychiatric/Behavioral: Negative for depression, suicidal ideas, hallucinations, memory loss and substance abuse. The patient is not nervous/anxious and does not have insomnia.       Blood pressure 125/83, pulse 98, temperature 99 F (37.2 C), temperature source Oral, resp. rate 18, last menstrual period 02/15/2012, SpO2 100.00%. Exam Physical Exam  Physical Exam  Vitals reviewed. Constitutional: She is oriented to person, place, and time. She appears well-developed and well-nourished.  HENT:  Head: Normocephalic and atraumatic.  Right Ear: External ear normal.  Left Ear: External ear normal.  Nose: Nose normal.  Mouth/Throat: Oropharynx is clear and moist.  Eyes: Conjunctivae and EOM are normal. Pupils are equal, round, and reactive to light. Right eye exhibits no discharge. Left eye exhibits no discharge. No scleral icterus.  Neck: Normal range of motion. Neck supple. No tracheal deviation present. No thyromegaly present.  Cardiovascular: Normal rate, regular rhythm, normal heart sounds and intact distal pulses.  Exam reveals no gallop and no friction rub.   No murmur heard. Respiratory: Effort normal and breath sounds normal. No respiratory distress. She has no wheezes. She has no rales. She exhibits no tenderness.  GI: Soft. Bowel sounds are normal. She exhibits no distension and no mass. There is tenderness. There is no rebound and no guarding.  Genitourinary:       Vulva is normal without lesions Vagina is pink moist without discharge Cervix normal in appearance and pap is normal Uterus  is enlarged term pregnancy Adnexa is negative with normal sized ovaries by sonogram  Musculoskeletal: Normal range of motion. She exhibits no edema and no  tenderness.  Neurological: She is alert and oriented to person, place, and time. She has normal reflexes. She displays normal reflexes. No cranial nerve deficit. She exhibits normal muscle tone. Coordination normal.  Skin: Skin is warm and dry. No rash noted. No erythema. No pallor.  Psychiatric: She has a normal mood and affect. Her behavior is normal. Judgment and thought content normal.    Prenatal labs: ABO, Rh: --/--/A POS (01/30 1124) Antibody: NEG (01/30 1124) Rubella: Immune (06/19 0000) RPR: NON REACTIVE (01/28 1035)  HBsAg: Negative (06/19 0000)  HIV: Non-reactive (06/19 0000)  GBS:     Assessment/Plan: 1.  39 2/[redacted] weeks gestation 2.  Breech, declines external cephalic version attempt   Proceed with primary Caesarean section  EURE,LUTHER H 11/16/2012, 1:02 PM

## 2012-11-16 NOTE — H&P (Signed)
Erica Farley is a 27 y.o. female presenting for primary cesarean section for breech presentation.  Pt receives prenatal care at [facility].  No complications this pregnancy. Declined external cephalic version. . Maternal Medical History:  Reason for admission: Reason for Admission:   nauseaFetal activity: Perceived fetal activity is normal.    Prenatal complications: no prenatal complications Prenatal Complications - Diabetes: none.    OB History    Grav Para Term Preterm Abortions TAB SAB Ect Mult Living   1         0     Past Medical History  Diagnosis Date  . Anxiety   . 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 Bladder stem    wisdom teeth  Family History: family history is not on file. Social History:  reports that she has never smoked. She does not have any smokeless tobacco history on file. She reports that she does not drink alcohol or use illicit drugs.    Prenatal Transfer Tool  Maternal Diabetes: No Genetic Screening: Declined Maternal Ultrasounds/Referrals: Normal Fetal Ultrasounds or other Referrals:  None Maternal Substance Abuse:  No Significant Maternal Medications:  Meds include: Other: prozac Significant Maternal Lab Results:  Lab values include: Group B Strep negative Other Comments:  None  Review of Systems  Constitutional: Negative for fever and chills.  Eyes: Negative for blurred vision and double vision.  Respiratory: Negative for shortness of breath.   Cardiovascular: Negative for chest pain.  Gastrointestinal: Negative for nausea, vomiting, abdominal pain and diarrhea.  Neurological: Positive for headaches (mild). Negative for dizziness.      Blood pressure 125/83, pulse 98, temperature 99 F (37.2 C), temperature source Oral, resp. rate 18, last menstrual period 02/15/2012, SpO2 100.00%. Maternal Exam:  Abdomen: Fetal presentation: breech     Fetal Exam Fetal Monitor Review: Mode:  hand-held doppler probe.   Baseline rate: 136.      Physical Exam  Constitutional: She is oriented to person, place, and time. She appears well-developed and well-nourished. No distress.  HENT:  Head: Atraumatic.  Eyes: Conjunctivae normal and EOM are normal.  Neck: Normal range of motion. Neck supple.  Cardiovascular: Normal rate.   Respiratory: Effort normal. No respiratory distress.  GI: Soft. There is no tenderness. There is no rebound and no guarding.  Musculoskeletal: Normal range of motion. She exhibits no edema and no tenderness.  Neurological: She is alert and oriented to person, place, and time.  Skin: Skin is warm and dry.  Psychiatric: She has a normal mood and affect.    Prenatal labs: ABO, Rh: --/--/A POS (01/30 1124) Antibody: PENDING (01/30 1124) Rubella: Immune (06/19 0000) RPR: NON REACTIVE (01/28 1035)  HBsAg: Negative (06/19 0000)  HIV: Non-reactive (06/19 0000)  GBS:     Assessment/Plan: 27 y.o. G1P0 at [redacted]w[redacted]d here for primary cesarean section due to breech presentation Antibiotics ordered, type and screen complete Proceed to OR when ready. Consent signed   Napoleon Form 11/16/2012, 12:38 PM

## 2012-11-16 NOTE — Transfer of Care (Signed)
Immediate Anesthesia Transfer of Care Note  Patient: Erica Farley  Procedure(s) Performed: Procedure(s) (LRB) with comments: CESAREAN SECTION (N/A)  Patient Location: PACU  Anesthesia Type:Spinal  Level of Consciousness: awake, alert , oriented and patient cooperative  Airway & Oxygen Therapy: Patient Spontanous Breathing  Post-op Assessment: Report given to PACU RN and Post -op Vital signs reviewed and stable  Post vital signs: Reviewed and stable  Complications: No apparent anesthesia complications

## 2012-11-16 NOTE — Anesthesia Postprocedure Evaluation (Signed)
  Anesthesia Post-op Note  Patient: Erica Farley  Procedure(s) Performed: Procedure(s) (LRB) with comments: CESAREAN SECTION (N/A)  Patient Location: PACU  Anesthesia Type:Spinal  Level of Consciousness: awake, alert  and oriented  Airway and Oxygen Therapy: Patient Spontanous Breathing  Post-op Pain: none  Post-op Assessment: Post-op Vital signs reviewed, Patient's Cardiovascular Status Stable, Respiratory Function Stable, Patent Airway, No signs of Nausea or vomiting, Pain level controlled, No headache, No backache and No residual numbness  Post-op Vital Signs: Reviewed and stable  Complications: No apparent anesthesia complications

## 2012-11-17 ENCOUNTER — Encounter (HOSPITAL_COMMUNITY): Payer: Self-pay | Admitting: Obstetrics & Gynecology

## 2012-11-17 LAB — CBC
HCT: 33.3 % — ABNORMAL LOW (ref 36.0–46.0)
Hemoglobin: 11.1 g/dL — ABNORMAL LOW (ref 12.0–15.0)
MCH: 32 pg (ref 26.0–34.0)
MCHC: 33.3 g/dL (ref 30.0–36.0)
MCV: 96 fL (ref 78.0–100.0)
Platelets: 153 10*3/uL (ref 150–400)
RBC: 3.47 MIL/uL — ABNORMAL LOW (ref 3.87–5.11)
RDW: 15.4 % (ref 11.5–15.5)
WBC: 13.8 10*3/uL — ABNORMAL HIGH (ref 4.0–10.5)

## 2012-11-17 NOTE — Anesthesia Postprocedure Evaluation (Signed)
  Anesthesia Post-op Note  Patient: Erica Farley  Procedure(s) Performed: Procedure(s) (LRB) with comments: CESAREAN SECTION (N/A)  Patient Location: Mother/Baby  Anesthesia Type:Spinal  Level of Consciousness: awake, alert  and oriented  Airway and Oxygen Therapy: Patient Spontanous Breathing  Post-op Pain: mild  Post-op Assessment: Patient's Cardiovascular Status Stable, Respiratory Function Stable, Patent Airway, No signs of Nausea or vomiting and Pain level controlled  Post-op Vital Signs: stable  Complications: No apparent anesthesia complications

## 2012-11-17 NOTE — Progress Notes (Signed)

## 2012-11-17 NOTE — Addendum Note (Signed)
Addendum  created 11/17/12 0759 by Lincoln Brigham, CRNA   Modules edited:Notes Section

## 2012-11-17 NOTE — Progress Notes (Signed)
Subjective: Postpartum Day 1: Cesarean Delivery Patient reports incisional pain, tolerating PO and + flatus.  Foley just out, has not voided on own yet.  Objective: Vital signs in last 24 hours: Temp:  [98 F (36.7 C)-99.8 F (37.7 C)] 98 F (36.7 C) (01/31 0522) Pulse Rate:  [82-105] 87  (01/31 0522) Resp:  [18-24] 18  (01/31 0522) BP: (97-137)/(51-83) 103/68 mmHg (01/31 0522) SpO2:  [94 %-100 %] 95 % (01/31 0130) Weight:  [92.08 kg (203 lb)] 92.08 kg (203 lb) (01/30 1700)  Physical Exam:  General: alert, cooperative and no distress Lochia: appropriate Uterine Fundus: firm Incision: no significant drainage, no dehiscence, no significant erythema DVT Evaluation: No evidence of DVT seen on physical exam. Negative Homan's sign. No cords or calf tenderness. No significant calf/ankle edema.   Basename 11/17/12 0550 11/14/12 1035  HGB 11.1* 13.8  HCT 33.3* 41.2    Assessment/Plan: Status post Cesarean section. Doing well postoperatively.  Continue current care. Lactation to see again today.   Napoleon Form 11/17/2012, 7:28 AM

## 2012-11-18 MED ORDER — OXYCODONE-ACETAMINOPHEN 5-325 MG PO TABS: 1.0000 | ORAL_TABLET | ORAL | Status: DC | PRN

## 2012-11-18 MED ORDER — IBUPROFEN 600 MG PO TABS: 600.0000 mg | ORAL_TABLET | Freq: Four times a day (QID) | ORAL | Status: DC

## 2012-11-18 MED ORDER — DOCUSATE SODIUM 100 MG PO CAPS: 100.0000 mg | ORAL_CAPSULE | Freq: Two times a day (BID) | ORAL | Status: DC | PRN

## 2012-11-18 NOTE — Discharge Summary (Signed)
Obstetric Discharge Summary Reason for Admission: scheduled cesarean section secondary to breech presentation Prenatal Procedures: none Intrapartum Procedures: cesarean: low cervical, transverse Postpartum Procedures: none Complications-Operative and Postpartum: none Hemoglobin  Date Value Range Status  11/17/2012 11.1* 12.0 - 15.0 g/dL Final     HCT  Date Value Range Status  11/17/2012 33.3* 36.0 - 46.0 % Final    Physical Exam:  Filed Vitals:   11/18/12 0557  BP: 111/69  Pulse: 69  Temp: 97.2 F (36.2 C)  Resp: 18    General: alert, cooperative and no distress Lochia: appropriate Uterine Fundus: firm, NT Incision: healing well, no significant drainage, no significant erythema DVT Evaluation: No cords or calf tenderness. No significant calf/ankle edema.  Discharge Diagnoses: Term Pregnancy-delivered  Discharge Information: Date: 11/18/2012 Activity: pelvic rest Diet: routine Medications: PNV, Ibuprofen, Colace and Percocet Condition: stable Instructions: refer to practice specific booklet Discharge to: home Follow-up Information    Please follow up. (Follow up as scheduled on Friday with [facility])          Newborn Data: Live born female  Birth Weight: 8 lb 10.3 oz (3920 g) APGAR: 8, 9  Home with mother.  Trev Boley 11/18/2012, 7:48 AM

## 2012-11-22 ENCOUNTER — Encounter (HOSPITAL_COMMUNITY): Payer: Self-pay | Admitting: *Deleted

## 2012-11-22 ENCOUNTER — Telehealth (HOSPITAL_COMMUNITY): Payer: Self-pay | Admitting: *Deleted

## 2012-11-22 NOTE — Telephone Encounter (Signed)
Resolve episode 

## 2014-01-09 ENCOUNTER — Other Ambulatory Visit: Payer: Self-pay | Admitting: Obstetrics & Gynecology

## 2014-02-08 ENCOUNTER — Other Ambulatory Visit: Payer: Self-pay | Admitting: Adult Health

## 2014-02-19 ENCOUNTER — Emergency Department (HOSPITAL_BASED_OUTPATIENT_CLINIC_OR_DEPARTMENT_OTHER)
Admission: EM | Admit: 2014-02-19 | Discharge: 2014-02-19 | Disposition: A | Discharge status: Home / Self Care | Payer: No Typology Code available for payment source | Attending: Emergency Medicine | Admitting: Emergency Medicine

## 2014-02-19 ENCOUNTER — Encounter (HOSPITAL_BASED_OUTPATIENT_CLINIC_OR_DEPARTMENT_OTHER): Payer: Self-pay | Admitting: Emergency Medicine

## 2014-02-19 DIAGNOSIS — Y929 Unspecified place or not applicable: Secondary | ICD-10-CM | POA: Insufficient documentation

## 2014-02-19 DIAGNOSIS — S0500XA Injury of conjunctiva and corneal abrasion without foreign body, unspecified eye, initial encounter: Secondary | ICD-10-CM

## 2014-02-19 DIAGNOSIS — IMO0002 Reserved for concepts with insufficient information to code with codable children: Secondary | ICD-10-CM | POA: Insufficient documentation

## 2014-02-19 DIAGNOSIS — Z79899 Other long term (current) drug therapy: Secondary | ICD-10-CM | POA: Insufficient documentation

## 2014-02-19 DIAGNOSIS — S058X9A Other injuries of unspecified eye and orbit, initial encounter: Secondary | ICD-10-CM | POA: Insufficient documentation

## 2014-02-19 DIAGNOSIS — Y9389 Activity, other specified: Secondary | ICD-10-CM | POA: Insufficient documentation

## 2014-02-19 DIAGNOSIS — F411 Generalized anxiety disorder: Secondary | ICD-10-CM | POA: Insufficient documentation

## 2014-02-19 MED ORDER — TETRACAINE HCL 0.5 % OP SOLN
2.0000 [drp] | Freq: Once | OPHTHALMIC | Status: AC
  Administered 2014-02-19: 2 [drp] via OPHTHALMIC
  Filled 2014-02-19: qty 2

## 2014-02-19 MED ORDER — ERYTHROMYCIN 5 MG/GM OP OINT
1.0000 "application " | TOPICAL_OINTMENT | OPHTHALMIC | Status: DC
  Administered 2014-02-19: 1 via OPHTHALMIC
  Filled 2014-02-19: qty 3.5

## 2014-02-19 MED ORDER — FLUORESCEIN SODIUM 1 MG OP STRP
1.0000 | ORAL_STRIP | Freq: Once | OPHTHALMIC | Status: AC
  Administered 2014-02-19: 1 via OPHTHALMIC
  Filled 2014-02-19: qty 1

## 2014-02-19 NOTE — ED Provider Notes (Signed)
CSN: [id number]     Arrival date & time 02/19/14  1824 History  This chart was scribed for Carmin Muskrat, MD by Delphia Grates, ED Scribe. This patient was seen in room MH06/MH06 and the patient's care was started at 6:43 PM.    Chief Complaint  Patient presents with  . Eye Problem     The history is provided by the patient. No language interpreter was used.   HPI Comments: Erica Farley is a 28 y.o. female who presents to the Emergency Department complaining of a right eye injury that occurred today. Patient states that her 73 month old infant accidentally "poked her right eye". There is associated blurred vision, tearing, photophobia, redness. She states her right eye "feels like there is something in it". She states the pain is worse when pressure is applied. She reports that she had taken ibuprofen earlier today with no relief. She denies HA, nausea, fever, emesis. Patient does not wear contacts nor glasses.    Past Medical History  Diagnosis Date  . Anxiety   . Headache(784.0)     due to TMJ- left side    Past Surgical History  Procedure Laterality Date  . Bladder stem     . Cesarean section  11/16/2012    Procedure: CESAREAN SECTION;  Surgeon: Florian Buff, MD;  Location: [facility];  Service: Obstetrics;  Laterality: N/A;   History reviewed. No pertinent family history. History  Substance Use Topics  . Smoking status: Never Smoker   . Smokeless tobacco: Not on file  . Alcohol Use: No     Comment: not while pregnant   OB History   Grav Para Term Preterm Abortions TAB SAB Ect Mult Living   1         0     Review of Systems  Constitutional:       Per HPI, otherwise negative  HENT:       Per HPI, otherwise negative  Respiratory:       Per HPI, otherwise negative  Cardiovascular:       Per HPI, otherwise negative  Gastrointestinal: Negative for vomiting.  Endocrine:       Negative aside from HPI  Genitourinary:       Neg aside from HPI   Musculoskeletal:        Per HPI, otherwise negative  Skin: Negative.   Neurological: Negative for syncope.      Allergies  Review of patient's allergies indicates no known allergies.  Home Medications   Prior to Admission medications   Medication Sig Start Date End Date Taking? Authorizing Provider  FLUoxetine (PROZAC) 10 MG capsule TAKE ONE CAPSULE BY MOUTH EVERY DAY 02/08/14   Estill Dooms, NP  FLUoxetine (PROZAC) 10 MG tablet Take 10 mg by mouth daily.    Historical Provider, MD  ibuprofen (ADVIL,MOTRIN) 600 MG tablet Take 1 tablet (600 mg total) by mouth every 6 (six) hours. 11/18/12   Mora Bellman, MD  TRI-PREVIFEM 0.18/0.215/0.25 MG-35 MCG tablet TAKE AS DIRECTED 01/09/14   Florian Buff, MD   Triage Vitals: BP 142/89  Pulse 84  Temp(Src) 98.9 F (37.2 C)  Resp 18  Ht 5\' 6"  (1.676 m)  Wt 175 lb (79.379 kg)  BMI 28.26 kg/m2  SpO2 100%  LMP 02/05/2014  Physical Exam  Nursing note and vitals reviewed. Constitutional: She is oriented to person, place, and time. She appears well-developed and well-nourished. No distress.  HENT:  Head: Normocephalic and atraumatic.  Eyes:  EOM are normal. Pupils are equal, round, and reactive to light. Right eye exhibits no chemosis, no discharge, no exudate and no hordeolum. No foreign body present in the right eye. Right conjunctiva is injected. Right conjunctiva has no hemorrhage. Left conjunctiva is not injected. Left conjunctiva has no hemorrhage.  Fundoscopic exam:      The right eye shows no hemorrhage.  Slit lamp exam:      The right eye shows corneal abrasion. The right eye shows no corneal flare and no corneal ulcer.  Cardiovascular: Normal rate, regular rhythm and normal heart sounds.   Pulmonary/Chest: Effort normal and breath sounds normal. No stridor. No respiratory distress.  Abdominal: She exhibits no distension.  Neurological: She is alert and oriented to person, place, and time. No cranial nerve deficit.  Skin: Skin is warm and dry.   Psychiatric: She has a normal mood and affect.    ED Course  Procedures (including critical care time)  DIAGNOSTIC STUDIES: UV light exam w fluorescein demonstrates corneal abrasion on R.    MDM  This generally well-appearing female presents several hours after being accidentally poked in the eye by her son with pain.  The patient is corneal abrasion visible in both without and with fluorescein staining. Patient is otherwise well, was discharged after initiation of erythromycin, with ophthalmology followup.  Carmin Muskrat, MD 02/19/14 [phone]

## 2014-02-19 NOTE — ED Notes (Signed)
Pt c/o right eye injury with pain and redness, poked in eye by infant this am

## 2014-02-19 NOTE — Discharge Instructions (Signed)
Corneal Abrasion The cornea is the clear covering at the front and center of the eye. When looking at the colored portion of the eye (iris), you are looking through the cornea. This very thin tissue is made up of many layers. The surface layer is a single layer of cells (corneal epithelium) and is one of the most sensitive tissues in the body. If a scratch or injury causes the corneal epithelium to come off, it is called a corneal abrasion. If the injury extends to the tissues below the epithelium, the condition is called a corneal ulcer. CAUSES   Scratches.  Trauma.  Foreign body in the eye. Some people have recurrences of abrasions in the area of the original injury even after it has healed (recurrent erosion syndrome). Recurrent erosion syndrome generally improves and goes away with time. SYMPTOMS   Eye pain.  Difficulty or inability to keep the injured eye open.  The eye becomes very sensitive to light.  Recurrent erosions tend to happen suddenly, first thing in the morning, usually after waking up and opening the eye. DIAGNOSIS  Your health care provider can diagnose a corneal abrasion during an eye exam. Dye is usually placed in the eye using a drop or a small paper strip moistened by your tears. When the eye is examined with a special light, the abrasion shows up clearly because of the dye. TREATMENT   Small abrasions may be treated with antibiotic drops or ointment alone.  Usually a pressure patch is specially applied. Pressure patches prevent the eye from blinking, allowing the corneal epithelium to heal. A pressure patch also reduces the amount of pain present in the eye during healing. Most corneal abrasions heal within 2 3 days with no effect on vision. If the abrasion becomes infected and spreads to the deeper tissues of the cornea, a corneal ulcer can result. This is serious because it can cause corneal scarring. Corneal scars interfere with light passing through the cornea  and cause a loss of vision in the involved eye. HOME CARE INSTRUCTIONS  Use medicine or ointment as directed. Only take over-the-counter or prescription medicines for pain, discomfort, or fever as directed by your health care provider.  Do not drive or operate machinery while your eye is patched. Your ability to judge distances is impaired.  If your health care provider has given you a follow-up appointment, it is very important to keep that appointment. Not keeping the appointment could result in a severe eye infection or permanent loss of vision. If there is any problem keeping the appointment, let your health care provider know. SEEK MEDICAL CARE IF:   You have pain, light sensitivity, and a scratchy feeling in one eye or both eyes.  Your pressure patch keeps loosening up, and you can blink your eye under the patch after treatment.  Any kind of discharge develops from the eye after treatment or if the lids stick together in the morning.  You have the same symptoms in the morning as you did with the original abrasion days, weeks, or months after the abrasion healed. MAKE SURE YOU:   Understand these instructions.  Will watch your condition.  Will get help right away if you are not doing well or get worse. Document Released: 10/01/2000 Document Revised: 07/25/2013 Document Reviewed: 06/11/2013 [facility] Patient Information 2014 [place].     Please be sure to continue using your erythromycin in the affected eye every fours hours until you have seen the ophthalmologist.

## 2014-06-05 ENCOUNTER — Telehealth: Payer: Self-pay | Admitting: Adult Health

## 2014-06-05 NOTE — Telephone Encounter (Signed)
Pt is feeling stressed and teary, has financial troubles,has changed insurances and has 28 year old, sometimes feels like heart races and is Short of breath,denies suicidal ideations, is taking prozac 10 mg, take 2 to increase it to 20 mg daily and make appt to be seen.

## 2014-06-10 ENCOUNTER — Ambulatory Visit (INDEPENDENT_AMBULATORY_CARE_PROVIDER_SITE_OTHER): Payer: No Typology Code available for payment source | Admitting: Adult Health

## 2014-06-10 ENCOUNTER — Encounter: Payer: Self-pay | Admitting: Adult Health

## 2014-06-10 VITALS — BP 128/80 | Ht 65.0 in | Wt 183.0 lb

## 2014-06-10 DIAGNOSIS — F411 Generalized anxiety disorder: Secondary | ICD-10-CM

## 2014-06-10 DIAGNOSIS — F419 Anxiety disorder, unspecified: Secondary | ICD-10-CM | POA: Insufficient documentation

## 2014-06-10 MED ORDER — ALPRAZOLAM 0.25 MG PO TABS: 0.2500 mg | ORAL_TABLET | Freq: Two times a day (BID) | ORAL | Status: DC | PRN

## 2014-06-10 MED ORDER — FLUOXETINE HCL 40 MG PO CAPS: 40.0000 mg | ORAL_CAPSULE | Freq: Every day | ORAL | Status: DC

## 2014-06-10 NOTE — Patient Instructions (Signed)
Generalized Anxiety Disorder Generalized anxiety disorder (GAD) is a mental disorder. It interferes with life functions, including relationships, work, and school. GAD is different from normal anxiety, which everyone experiences at some point in their lives in response to specific life events and activities. Normal anxiety actually helps [place] prepare for and get through these life events and activities. Normal anxiety goes away after the event or activity is over.  GAD causes anxiety that is not necessarily related to specific events or activities. It also causes excess anxiety in proportion to specific events or activities. The anxiety associated with GAD is also difficult to control. GAD can vary from mild to severe. People with severe GAD can have intense waves of anxiety with physical symptoms (panic attacks).  SYMPTOMS The anxiety and worry associated with GAD are difficult to control. This anxiety and worry are related to many life events and activities and also occur more days than not for 6 months or longer. People with GAD also have three or more of the following symptoms (one or more in children):  Restlessness.   Fatigue.  Difficulty concentrating.   Irritability.  Muscle tension.  Difficulty sleeping or unsatisfying sleep. DIAGNOSIS GAD is diagnosed through an assessment by your health care provider. Your health care provider will ask you questions aboutyour mood,physical symptoms, and events in your life. Your health care provider may ask you about your medical history and use of alcohol or drugs, including prescription medicines. Your health care provider may also do a physical exam and blood tests. Certain medical conditions and the use of certain substances can cause symptoms similar to those associated with GAD. Your health care provider may refer you to a mental health specialist for further evaluation. TREATMENT The following therapies are usually used to treat GAD:    Medication. Antidepressant medication usually is prescribed for long-term daily control. Antianxiety medicines may be added in severe cases, especially when panic attacks occur.   Talk therapy (psychotherapy). Certain types of talk therapy can be helpful in treating GAD by providing support, education, and guidance. A form of talk therapy called cognitive behavioral therapy can teach you healthy ways to think about and react to daily life events and activities.  Stress managementtechniques. These include yoga, meditation, and exercise and can be very helpful when they are practiced regularly. A mental health specialist can help determine which treatment is best for you. Some people see improvement with one therapy. However, other people require a combination of therapies. Document Released: 01/29/2013 Document Revised: 02/18/2014 Document Reviewed: 01/29/2013 [facility] Patient Information 2015 [place], [place]. This information is not intended to replace advice given to you by your health care provider. Make sure you discuss any questions you have with your health care provider. Take 40 mg prozac daily Xanax bid prn Follow up in 6 weeks for pap and physical

## 2014-06-10 NOTE — Progress Notes (Addendum)
Subjective:     Patient ID: Erica Farley, female   DOB: 14-Aug-1986, 28 y.o.   MRN: [id number]  HPI Erica Farley is a 28 year old white female in to discuss increasing prozac.Has been taking 10 mg then increased to 20 mg, thinks she needs 40 mg was on that before pregnancy.Has had some financial issues  And is living with parents.Has had some chest tightness and teary spells.  Review of Systems See HPI Reviewed past medical,surgical, social and family history. Reviewed medications and allergies.     Objective:   Physical Exam BP 128/80  Ht 5\' 5"  (1.651 m)  Wt 183 lb (83.008 kg)  BMI 30.45 kg/m2  LMP 05/29/2014  Breastfeeding? No   Discussed taking time for self, talking with partner and have some money for self. But keeping communication open is important and work [employer] with him.  Assessment:     Anxiety     Plan:     Rx prozac 40 mg #90 1 daily with 3 refills Rx xanax 0.25 mg #30 1 bid prn with 2 refills Follow up in 6 weeks Review handout on anxiety Take time for self   Offered counseling,declined for now.

## 2014-06-13 ENCOUNTER — Ambulatory Visit: Payer: No Typology Code available for payment source | Admitting: Family Medicine

## 2014-07-16 ENCOUNTER — Encounter: Payer: Self-pay | Admitting: Obstetrics & Gynecology

## 2014-07-16 ENCOUNTER — Ambulatory Visit (INDEPENDENT_AMBULATORY_CARE_PROVIDER_SITE_OTHER): Payer: No Typology Code available for payment source | Admitting: Obstetrics & Gynecology

## 2014-07-16 VITALS — BP 130/90 | Ht 66.0 in | Wt 181.0 lb

## 2014-07-16 DIAGNOSIS — M542 Cervicalgia: Secondary | ICD-10-CM

## 2014-07-16 MED ORDER — CYCLOBENZAPRINE HCL 10 MG PO TABS: 10.0000 mg | ORAL_TABLET | Freq: Three times a day (TID) | ORAL | Status: DC | PRN

## 2014-07-17 NOTE — Progress Notes (Signed)
Patient ID: Erica Farley, female   DOB: 1986/05/27, 28 y.o.   MRN: [id number] History of right trap and headaches  Exam Triggers througout both sides, trap, rhomboids and neck  20 cc total 0.5% throught neck back should traps  Follow up prn

## 2014-07-23 ENCOUNTER — Encounter: Payer: Self-pay | Admitting: Adult Health

## 2014-07-23 ENCOUNTER — Ambulatory Visit (INDEPENDENT_AMBULATORY_CARE_PROVIDER_SITE_OTHER): Payer: No Typology Code available for payment source | Admitting: Adult Health

## 2014-07-23 ENCOUNTER — Other Ambulatory Visit (HOSPITAL_COMMUNITY)
Admission: RE | Admit: 2014-07-23 | Discharge: 2014-07-23 | Disposition: A | Discharge status: Home / Self Care | Payer: No Typology Code available for payment source | Source: Ambulatory Visit | Attending: Obstetrics and Gynecology | Admitting: Obstetrics and Gynecology

## 2014-07-23 VITALS — BP 140/82 | HR 82 | Ht 65.0 in | Wt 183.0 lb

## 2014-07-23 DIAGNOSIS — Z309 Encounter for contraceptive management, unspecified: Secondary | ICD-10-CM

## 2014-07-23 DIAGNOSIS — G8929 Other chronic pain: Secondary | ICD-10-CM

## 2014-07-23 DIAGNOSIS — R51 Headache: Secondary | ICD-10-CM

## 2014-07-23 DIAGNOSIS — Z3041 Encounter for surveillance of contraceptive pills: Secondary | ICD-10-CM

## 2014-07-23 DIAGNOSIS — Z01419 Encounter for gynecological examination (general) (routine) without abnormal findings: Secondary | ICD-10-CM | POA: Insufficient documentation

## 2014-07-23 DIAGNOSIS — R519 Headache, unspecified: Secondary | ICD-10-CM

## 2014-07-23 DIAGNOSIS — F419 Anxiety disorder, unspecified: Secondary | ICD-10-CM

## 2014-07-23 HISTORY — DX: Headache, unspecified: R51.9

## 2014-07-23 HISTORY — DX: Other chronic pain: G89.29

## 2014-07-23 HISTORY — DX: Encounter for contraceptive management, unspecified: Z30.9

## 2014-07-23 MED ORDER — RIZATRIPTAN BENZOATE 10 MG PO TABS: 10.0000 mg | ORAL_TABLET | ORAL | Status: DC | PRN

## 2014-07-23 MED ORDER — NORETHIN ACE-ETH ESTRAD-FE 1-20 MG-MCG(24) PO CHEW: 1.0000 | CHEWABLE_TABLET | Freq: Every day | ORAL | Status: DC

## 2014-07-23 MED ORDER — BUSPIRONE HCL 7.5 MG PO TABS: ORAL_TABLET | ORAL | Status: DC

## 2014-07-23 MED ORDER — ALPRAZOLAM 0.5 MG PO TABS: 0.5000 mg | ORAL_TABLET | Freq: Two times a day (BID) | ORAL | Status: DC | PRN

## 2014-07-23 NOTE — Progress Notes (Signed)
Patient ID: Erica Farley, female   DOB: [birth year], 28 y.o.   MRN: [id number] History of Present Illness: Erica Farley is a 28 year old white female, married in for a pap and physical.She is complaining of headaches, esp at period,has had since hight school.Also not where she wants to be with Prozac yet.But is better than before, not teary.Has sen chiropractor for pain in neck and shoulder and headaches.   Current Medications, Allergies, Past Medical History, Past Surgical History, Family History and Social History were reviewed in [employer] record.     Review of Systems: Patient denies any blurred vision, shortness of breath, chest pain, abdominal pain, problems with bowel movements, urination, or intercourse. No joint pain, see HPI for positives.    Physical Exam:BP 140/82  Pulse 82  Ht 5\' 5"  (1.651 m)  Wt 183 lb (83.008 kg)  BMI 30.45 kg/m2  LMP 06/26/2014 General:  Well developed, well nourished, no acute distress Skin:  Warm and dry Neck:  Midline trachea, normal thyroid Lungs; Clear to auscultation bilaterally Breast:  No dominant palpable mass, retraction, or nipple discharge Cardiovascular: Regular rate and rhythm Abdomen:  Soft, non tender, no hepatosplenomegaly Pelvic:  External genitalia is normal in appearance.  The vagina is normal in appearance.   The cervix is bulbous. Pap performed. Uterus is felt to be normal size, shape, and contour.  No  adnexal masses or tenderness noted. Extremities:  No swelling or varicosities noted Psych: alert and cooperative,seems better   Impression: Well woman gyn exam Contraceptive management Headaches Anxiety     Plan: Physical in 1 year Return in 6 weeks for follow up Continue Prozac Rx Buspar 7.5 mg #60 1 bid with 3 refills Rx maxalt 10 mg #10 1 at headache then 1 in 2 hours if needed no refills Rx minastrin # 3 packs given  take 1 continuously, lot [id number] A exp 7/16  Rx xanax 0.5 mg #60 1 bid prn  with 1 refill Call prn Use condoms x 1 pack of OCs

## 2014-07-23 NOTE — Patient Instructions (Signed)
Follow up in 6 weeks Physical in 1 year

## 2014-07-25 LAB — CYTOLOGY - PAP

## 2014-08-13 ENCOUNTER — Ambulatory Visit: Payer: No Typology Code available for payment source | Admitting: Obstetrics & Gynecology

## 2014-08-19 ENCOUNTER — Encounter: Payer: Self-pay | Admitting: Adult Health

## 2014-09-03 ENCOUNTER — Ambulatory Visit: Payer: No Typology Code available for payment source | Admitting: Adult Health

## 2014-09-04 ENCOUNTER — Telehealth: Payer: Self-pay | Admitting: Adult Health

## 2014-09-04 NOTE — Telephone Encounter (Signed)
Mom called Erica Farley has appt Friday, she is having pain in shoulder and neck, took some of Moms pain meds.talk with her at appt.

## 2014-09-06 ENCOUNTER — Encounter: Payer: Self-pay | Admitting: Adult Health

## 2014-09-06 ENCOUNTER — Ambulatory Visit (INDEPENDENT_AMBULATORY_CARE_PROVIDER_SITE_OTHER): Payer: No Typology Code available for payment source | Admitting: Adult Health

## 2014-09-06 VITALS — BP 118/80 | Ht 65.0 in | Wt 184.0 lb

## 2014-09-06 DIAGNOSIS — N62 Hypertrophy of breast: Secondary | ICD-10-CM

## 2014-09-06 DIAGNOSIS — M542 Cervicalgia: Secondary | ICD-10-CM

## 2014-09-06 DIAGNOSIS — F419 Anxiety disorder, unspecified: Secondary | ICD-10-CM

## 2014-09-06 NOTE — Patient Instructions (Addendum)
Continue meds  Refer to Dr Luna Glasgow Go back to chiropractor  Follow up 8 weeks Back Pain, Adult Low back pain is very common. About 1 in 5 people have back pain.The cause of low back pain is rarely dangerous. The pain often gets better over time.About half of people with a sudden onset of back pain feel better in just 2 weeks. About 8 in 10 people feel better by 6 weeks.  CAUSES Some common causes of back pain include:  Strain of the muscles or ligaments supporting the spine.  Wear and tear (degeneration) of the spinal discs.  Arthritis.  Direct injury to the back. DIAGNOSIS Most of the time, the direct cause of low back pain is not known.However, back pain can be treated effectively even when the exact cause of the pain is unknown.Answering your caregiver's questions about your overall health and symptoms is one of the most accurate ways to make sure the cause of your pain is not dangerous. If your caregiver needs more information, he or she may order lab work or imaging tests (X-rays or MRIs).However, even if imaging tests show changes in your back, this usually does not require surgery. HOME CARE INSTRUCTIONS For many people, back pain returns.Since low back pain is rarely dangerous, it is often a condition that people can learn to [facility] their own.   Remain active. It is stressful on the back to sit or stand in one place. Do not sit, drive, or stand in one place for more than 30 minutes at a time. Take short walks on level surfaces as soon as pain allows.Try to increase the length of time you walk each day.  Do not stay in bed.Resting more than 1 or 2 days can delay your recovery.  Do not avoid exercise or work.Your body is made to move.It is not dangerous to be active, even though your back may hurt.Your back will likely heal faster if you return to being active before your pain is gone.  Pay attention to your body when you bend and lift. Many people have less  discomfortwhen lifting if they bend their knees, keep the load close to their bodies,and avoid twisting. Often, the most comfortable positions are those that put less stress on your recovering back.  Find a comfortable position to sleep. Use a firm mattress and lie on your side with your knees slightly bent. If you lie on your back, put a pillow under your knees.  Only take over-the-counter or prescription medicines as directed by your caregiver. Over-the-counter medicines to reduce pain and inflammation are often the most helpful.Your caregiver may prescribe muscle relaxant drugs.These medicines help dull your pain so you can more quickly return to your normal activities and healthy exercise.  Put ice on the injured area.  Put ice in a plastic bag.  Place a towel between your skin and the bag.  Leave the ice on for 15-20 minutes, 03-04 times a day for the first 2 to 3 days. After that, ice and heat may be alternated to reduce pain and spasms.  Ask your caregiver about trying back exercises and gentle massage. This may be of some benefit.  Avoid feeling anxious or stressed.Stress increases muscle tension and can worsen back pain.It is important to recognize when you are anxious or stressed and learn ways to manage it.Exercise is a great option. SEEK MEDICAL CARE IF:  You have pain that is not relieved with rest or medicine.  You have pain that does not improve  in 1 week.  You have new symptoms.  You are generally not feeling well. SEEK IMMEDIATE MEDICAL CARE IF:   You have pain that radiates from your back into your legs.  You develop new bowel or bladder control problems.  You have unusual weakness or numbness in your arms or legs.  You develop nausea or vomiting.  You develop abdominal pain.  You feel faint. Document Released: 10/04/2005 Document Revised: 04/04/2012 Document Reviewed: 02/05/2014 [facility] Patient Information 2015 [place], [place]. This information is  not intended to replace advice given to you by your health care provider. Make sure you discuss any questions you have with your health care provider. Alternate heat and ice

## 2014-09-06 NOTE — Progress Notes (Signed)
Subjective:     Patient ID: Erica Farley, female   DOB: 06-16-1986, 28 y.o.   MRN: [id number]  HPI Tereasa is a 28 year old white female, married, back in follow up of adding Buspar to Prozac and taking Minastrin continuously,(headaches better).She stopped Buspar was not helping and was $70.She is complaining of pain in neck and shoulders.She had injection by Dr Elonda Husky and was seeing chiropractor but stopped, and she took some of Mom's pain pills, which gave her energy and did help with pain.Mom made her stop.She thinks her large breasts is part of problem, has indents in shoulders, wears 38DD, may want breast reduction..Mom with her today.  Review of Systems See HPI Reviewed past medical,surgical, social and family history. Reviewed medications and allergies.     Objective:   Physical Exam BP 118/80 mmHg  Ht 5\' 5"  (1.651 m)  Wt 184 lb (83.462 kg)  BMI 30.62 kg/m2   Has indents in both shoulders, no muscle spasms to day, thinks her moods are OK but wants to feel better,does not like pain.Counseled not to take pain meds not prescribed for you and she voices understanding.She is not suicidal.   Assessment:     Anxiety  Neck pain Large breasts    Plan:     Continue prozac and xanax prn and Minastrin and flexeril Refer to Dr Luna Glasgow, wants to be seen in [facility] to evaluate neck and shoulders Go back to chiropractor Alternate heat and ice Go get fitted for better bra  Follow up in 8 weeks

## 2014-09-19 ENCOUNTER — Other Ambulatory Visit: Payer: Self-pay | Admitting: Adult Health

## 2014-10-23 ENCOUNTER — Other Ambulatory Visit: Payer: Self-pay | Admitting: Adult Health

## 2014-11-06 ENCOUNTER — Ambulatory Visit: Payer: No Typology Code available for payment source | Admitting: Adult Health

## 2014-11-12 ENCOUNTER — Encounter: Payer: Self-pay | Admitting: Adult Health

## 2014-11-12 ENCOUNTER — Ambulatory Visit (INDEPENDENT_AMBULATORY_CARE_PROVIDER_SITE_OTHER): Payer: BLUE CROSS/BLUE SHIELD | Admitting: Adult Health

## 2014-11-12 VITALS — BP 136/82 | Ht 65.0 in | Wt 183.5 lb

## 2014-11-12 DIAGNOSIS — F419 Anxiety disorder, unspecified: Secondary | ICD-10-CM

## 2014-11-12 DIAGNOSIS — Z3041 Encounter for surveillance of contraceptive pills: Secondary | ICD-10-CM

## 2014-11-12 MED ORDER — NORETHIN ACE-ETH ESTRAD-FE 1-20 MG-MCG(24) PO CHEW: CHEWABLE_TABLET | ORAL | Status: DC

## 2014-11-12 NOTE — Progress Notes (Signed)
Subjective:     Patient ID: Erica Farley, female   DOB: [birth year], 29 y.o.   MRN: [id number]  HPI Erica Farley is a 29 year old white female, married in for follow up of taking xanax prn with prozac.She says better, not crazy feeling.  Review of Systems See HPI Reviewed past medical,surgical, social and family history. Reviewed medications and allergies.     Objective:   Physical Exam BP 136/82 mmHg  Ht 5\' 5"  (1.651 m)  Wt 183 lb 8 oz (83.235 kg)  BMI 30.54 kg/m2   Feels better now, but does not like tri sprintec is cheap but has headaches, will go back to minastrin  Has seen chiropractor but not seen Dr Luna Glasgow yet.  Assessment:     Anxiety  Contraceptive management    Plan:     Continue prozac and xanax prn Rx minastrin x 1 year and 1 pack given [id number] A exp 7/16 Follow up in 3 months

## 2014-11-12 NOTE — Patient Instructions (Signed)
Follow up in 3 months Continue with chiropractor See Dr Luna Glasgow

## 2014-11-20 ENCOUNTER — Other Ambulatory Visit: Payer: Self-pay | Admitting: Adult Health

## 2014-12-18 ENCOUNTER — Other Ambulatory Visit: Payer: Self-pay | Admitting: Adult Health

## 2015-01-15 ENCOUNTER — Other Ambulatory Visit: Payer: Self-pay | Admitting: Adult Health

## 2015-01-17 ENCOUNTER — Telehealth: Payer: Self-pay | Admitting: Adult Health

## 2015-01-17 MED ORDER — NORGESTIM-ETH ESTRAD TRIPHASIC 0.18/0.215/0.25 MG-35 MCG PO TABS: 1.0000 | ORAL_TABLET | Freq: Every day | ORAL | Status: DC

## 2015-01-17 NOTE — Telephone Encounter (Signed)
Will D/C minastrin and rx tri sprintec

## 2015-01-17 NOTE — Telephone Encounter (Signed)
Spoke with pt. Pt has been taking Minastrin but it's $195.00. Pt has been on TriSprintec before and is wanting that again. Can you send to pharmacy? Thanks!! [place]

## 2015-02-11 ENCOUNTER — Ambulatory Visit: Payer: BLUE CROSS/BLUE SHIELD | Admitting: Adult Health

## 2015-02-12 ENCOUNTER — Other Ambulatory Visit: Payer: Self-pay | Admitting: Adult Health

## 2015-02-26 ENCOUNTER — Ambulatory Visit: Payer: BLUE CROSS/BLUE SHIELD | Admitting: Adult Health

## 2015-03-06 ENCOUNTER — Encounter: Payer: Self-pay | Admitting: Adult Health

## 2015-03-06 ENCOUNTER — Ambulatory Visit (INDEPENDENT_AMBULATORY_CARE_PROVIDER_SITE_OTHER): Payer: BLUE CROSS/BLUE SHIELD | Admitting: Adult Health

## 2015-03-06 VITALS — BP 128/82 | HR 80 | Ht 65.0 in | Wt 188.0 lb

## 2015-03-06 DIAGNOSIS — G43829 Menstrual migraine, not intractable, without status migrainosus: Secondary | ICD-10-CM | POA: Diagnosis not present

## 2015-03-06 DIAGNOSIS — F419 Anxiety disorder, unspecified: Secondary | ICD-10-CM | POA: Diagnosis not present

## 2015-03-06 DIAGNOSIS — N943 Premenstrual tension syndrome: Secondary | ICD-10-CM | POA: Diagnosis not present

## 2015-03-06 DIAGNOSIS — M79643 Pain in unspecified hand: Secondary | ICD-10-CM | POA: Diagnosis not present

## 2015-03-06 HISTORY — DX: Menstrual migraine, not intractable, without status migrainosus: G43.829

## 2015-03-06 MED ORDER — ALPRAZOLAM 0.5 MG PO TABS: 0.5000 mg | ORAL_TABLET | Freq: Two times a day (BID) | ORAL | Status: DC | PRN

## 2015-03-06 NOTE — Progress Notes (Signed)
Subjective:     Patient ID: Erica Farley, female   DOB: 20-Jun-1986, 29 y.o.   MRN: [id number]  HPI Erica Farley is a 29 year old white female, married back in follow up of taking prozac 40 mg and xanax as needed for anxiety.She is feeling better but has moments when heart races.She has started tri sprintec because it is free, the minastrin was $195 per pack, but she has noticed menstrual headache.And has noticed in last 2-3 weeks fingers feel stiff and swollen first thing in am, then get better has day goes on.  Review of Systems See HPI for positives, all other systems negative Reviewed past medical,surgical, social and family history. Reviewed medications and allergies.     Objective:   Physical Exam BP 128/82 mmHg  Pulse 80  Ht 5\' 5"  (1.651 m)  Wt 188 lb (85.276 kg)  BMI 31.28 kg/m2  LMP 02/13/2015 Skin warm and dry. Neck: mid line trachea, normal thyroid, good ROM, no lymphadenopathy noted. Lungs: clear to ausculation bilaterally. Cardiovascular: regular rate and rhythm.No swelling in hands or fingers now.    Assessment:     Anxiety    Menstrual headache Hands stiff  Plan:     Continue prozac Rx xanax 0.5 mg #60 1 bid prn anxiety no refills Instead of taking 7 days of inert pills, take 3 then start new pack of pills, to see if helps Try warming hands first thing in am and watch for now,sound like OA not RA Follow up prn

## 2015-03-06 NOTE — Patient Instructions (Signed)
Will continue meds Follow up prn

## 2015-04-04 ENCOUNTER — Other Ambulatory Visit: Payer: Self-pay | Admitting: Adult Health

## 2015-05-05 ENCOUNTER — Telehealth: Payer: Self-pay | Admitting: Adult Health

## 2015-05-05 MED ORDER — ALPRAZOLAM 0.5 MG PO TABS: 0.5000 mg | ORAL_TABLET | Freq: Two times a day (BID) | ORAL | Status: DC | PRN

## 2015-05-05 NOTE — Telephone Encounter (Signed)
Left message letting pt know I will route this note to [place] and to check with pharmacy later today if she doesn't hear back from [place]. [place]

## 2015-05-05 NOTE — Telephone Encounter (Signed)
Will refill xanax

## 2015-06-02 ENCOUNTER — Other Ambulatory Visit: Payer: Self-pay | Admitting: Adult Health

## 2015-06-30 ENCOUNTER — Other Ambulatory Visit: Payer: Self-pay | Admitting: Adult Health

## 2015-07-02 ENCOUNTER — Other Ambulatory Visit: Payer: Self-pay | Admitting: Adult Health

## 2015-07-31 ENCOUNTER — Other Ambulatory Visit: Payer: Self-pay | Admitting: Adult Health

## 2015-08-30 ENCOUNTER — Other Ambulatory Visit: Payer: Self-pay | Admitting: Adult Health

## 2015-10-01 ENCOUNTER — Other Ambulatory Visit: Payer: Self-pay | Admitting: Adult Health

## 2015-10-31 ENCOUNTER — Other Ambulatory Visit: Payer: Self-pay | Admitting: Adult Health

## 2015-11-06 ENCOUNTER — Other Ambulatory Visit: Payer: Self-pay | Admitting: Adult Health

## 2015-12-01 ENCOUNTER — Other Ambulatory Visit: Payer: Self-pay | Admitting: Adult Health

## 2016-01-05 ENCOUNTER — Other Ambulatory Visit: Payer: Self-pay | Admitting: *Deleted

## 2016-01-05 MED ORDER — ALPRAZOLAM 0.5 MG PO TABS: 0.5000 mg | ORAL_TABLET | Freq: Two times a day (BID) | ORAL | Status: DC | PRN

## 2016-01-12 ENCOUNTER — Other Ambulatory Visit: Payer: Self-pay | Admitting: Adult Health

## 2016-01-28 ENCOUNTER — Ambulatory Visit: Payer: No Typology Code available for payment source | Admitting: Family Medicine

## 2016-02-03 ENCOUNTER — Other Ambulatory Visit: Payer: Self-pay | Admitting: Adult Health

## 2016-02-09 ENCOUNTER — Encounter: Payer: Self-pay | Admitting: Adult Health

## 2016-02-09 ENCOUNTER — Ambulatory Visit (INDEPENDENT_AMBULATORY_CARE_PROVIDER_SITE_OTHER): Payer: BLUE CROSS/BLUE SHIELD | Admitting: Adult Health

## 2016-02-09 VITALS — BP 136/90 | HR 88 | Ht 65.0 in | Wt 194.0 lb

## 2016-02-09 DIAGNOSIS — F32A Depression, unspecified: Secondary | ICD-10-CM

## 2016-02-09 DIAGNOSIS — R5383 Other fatigue: Secondary | ICD-10-CM

## 2016-02-09 DIAGNOSIS — F329 Major depressive disorder, single episode, unspecified: Secondary | ICD-10-CM

## 2016-02-09 DIAGNOSIS — R52 Pain, unspecified: Secondary | ICD-10-CM | POA: Diagnosis not present

## 2016-02-09 DIAGNOSIS — F419 Anxiety disorder, unspecified: Secondary | ICD-10-CM | POA: Diagnosis not present

## 2016-02-09 MED ORDER — ALPRAZOLAM 0.5 MG PO TABS: 0.5000 mg | ORAL_TABLET | Freq: Two times a day (BID) | ORAL | Status: DC | PRN

## 2016-02-09 NOTE — Progress Notes (Signed)
Subjective:     Patient ID: Erica Farley, female   DOB: 1986-03-20, 30 y.o.   MRN: [id number]  HPI Erica Farley is a 30 year old white female, back in follow up of taking prozac and xanax for anxiety,she says she has fatigue and decreased energy and body aches, she wonder if fibromyalgia, since mom has it.She feels shaky in side and heart races and chest gets tight at times. She says home life better, and is not suicidal, but wonders if prozac needs changing or increased. She stopped OCs due to headaches and they have stopped, she is using condoms.   Review of Systems Patient denies any headaches, hearing loss,  blurred vision, shortness of breath, chest pain, abdominal pain, problems with bowel movements, urination, or intercourse.See HPI for positives. Reviewed past medical,surgical, social and family history. Reviewed medications and allergies.     Objective:   Physical Exam BP 136/90 mmHg  Pulse 88  Ht 5\' 5"  (1.651 m)  Wt 194 lb (87.998 kg)  BMI 32.28 kg/m2  LMP 01/31/2016 Skin warm and dry, has about 6-8 +pressure points, for pain with palpation, PHQ 9 score 14.Will leave prozac dose and xanax the same and refer to Dr Caprice Beaver.   Increase activity, is leaving for [facility] 02/21/16. Continue to keep log of feelings  Face time 15 minutes with 50% counseling.   Assessment:     Anxiety Depression Fatigue  Body aches    Plan:     Check CBC,CMP and TSH Continue prozac at 40 mg,has refills Rx xanax 0.5 mg #60 take 1 bid prn anxiety with 1 refill Call Dr Letta Moynahan for appt, card given Return in 1 month for physical   Get PCP

## 2016-02-09 NOTE — Patient Instructions (Signed)
Will continue current  See Dr Caprice Beaver Follow up in 4 weeks

## 2016-02-10 ENCOUNTER — Telehealth: Payer: Self-pay | Admitting: Adult Health

## 2016-02-10 ENCOUNTER — Encounter: Payer: Self-pay | Admitting: Adult Health

## 2016-02-10 DIAGNOSIS — E039 Hypothyroidism, unspecified: Secondary | ICD-10-CM

## 2016-02-10 HISTORY — DX: Hypothyroidism, unspecified: E03.9

## 2016-02-10 LAB — COMPREHENSIVE METABOLIC PANEL
ALT: 21 IU/L (ref 0–32)
AST: 22 IU/L (ref 0–40)
Albumin/Globulin Ratio: 1.6 (ref 1.2–2.2)
Albumin: 4.7 g/dL (ref 3.5–5.5)
Alkaline Phosphatase: 81 IU/L (ref 39–117)
BUN/Creatinine Ratio: 18 (ref 9–23)
BUN: 14 mg/dL (ref 6–20)
Bilirubin Total: 0.4 mg/dL (ref 0.0–1.2)
CO2: 25 mmol/L (ref 18–29)
Calcium: 9.7 mg/dL (ref 8.7–10.2)
Chloride: 100 mmol/L (ref 96–106)
Creatinine, Ser: 0.78 mg/dL (ref 0.57–1.00)
GFR calc Af Amer: 118 mL/min/{1.73_m2} (ref >59)
GFR calc non Af Amer: 102 mL/min/{1.73_m2} (ref >59)
Globulin, Total: 3 g/dL (ref 1.5–4.5)
Glucose: 80 mg/dL (ref 65–99)
Potassium: 4.4 mmol/L (ref 3.5–5.2)
Sodium: 141 mmol/L (ref 134–144)
Total Protein: 7.7 g/dL (ref 6.0–8.5)

## 2016-02-10 LAB — CBC
Hematocrit: 43.3 % (ref 34.0–46.6)
Hemoglobin: 14.1 g/dL (ref 11.1–15.9)
MCH: 30.3 pg (ref 26.6–33.0)
MCHC: 32.6 g/dL (ref 31.5–35.7)
MCV: 93 fL (ref 79–97)
Platelets: 301 10*3/uL (ref 150–379)
RBC: 4.65 x10E6/uL (ref 3.77–5.28)
RDW: 13.9 % (ref 12.3–15.4)
WBC: 7.6 10*3/uL (ref 3.4–10.8)

## 2016-02-10 LAB — TSH: TSH: 9.28 u[IU]/mL — ABNORMAL HIGH (ref 0.450–4.500)

## 2016-02-10 MED ORDER — LEVOTHYROXINE SODIUM 50 MCG PO TABS: 50.0000 ug | ORAL_TABLET | Freq: Every day | ORAL | Status: DC

## 2016-02-10 NOTE — Telephone Encounter (Signed)
Left message to call me about labs, TSH elevated

## 2016-02-10 NOTE — Telephone Encounter (Signed)
Pt aware of labs, and elevated TSh will rx synthroid 50 mcg and recheck in 8 weeks

## 2016-03-11 ENCOUNTER — Other Ambulatory Visit: Payer: BLUE CROSS/BLUE SHIELD | Admitting: Adult Health

## 2016-03-24 ENCOUNTER — Other Ambulatory Visit: Payer: Self-pay | Admitting: *Deleted

## 2016-03-29 ENCOUNTER — Other Ambulatory Visit (HOSPITAL_COMMUNITY)
Admission: RE | Admit: 2016-03-29 | Discharge: 2016-03-29 | Disposition: A | Discharge status: Home / Self Care | Payer: BLUE CROSS/BLUE SHIELD | Source: Ambulatory Visit | Attending: Adult Health | Admitting: Adult Health

## 2016-03-29 ENCOUNTER — Encounter: Payer: Self-pay | Admitting: Adult Health

## 2016-03-29 ENCOUNTER — Ambulatory Visit (INDEPENDENT_AMBULATORY_CARE_PROVIDER_SITE_OTHER): Payer: BLUE CROSS/BLUE SHIELD | Admitting: Adult Health

## 2016-03-29 VITALS — BP 118/80 | HR 82 | Ht 65.25 in | Wt 195.0 lb

## 2016-03-29 DIAGNOSIS — E039 Hypothyroidism, unspecified: Secondary | ICD-10-CM

## 2016-03-29 DIAGNOSIS — F419 Anxiety disorder, unspecified: Secondary | ICD-10-CM

## 2016-03-29 DIAGNOSIS — F32A Depression, unspecified: Secondary | ICD-10-CM

## 2016-03-29 DIAGNOSIS — Z1151 Encounter for screening for human papillomavirus (HPV): Secondary | ICD-10-CM | POA: Diagnosis not present

## 2016-03-29 DIAGNOSIS — Z01419 Encounter for gynecological examination (general) (routine) without abnormal findings: Secondary | ICD-10-CM | POA: Diagnosis present

## 2016-03-29 DIAGNOSIS — F329 Major depressive disorder, single episode, unspecified: Secondary | ICD-10-CM | POA: Diagnosis not present

## 2016-03-29 DIAGNOSIS — N62 Hypertrophy of breast: Secondary | ICD-10-CM

## 2016-03-29 DIAGNOSIS — Z01411 Encounter for gynecological examination (general) (routine) with abnormal findings: Secondary | ICD-10-CM | POA: Diagnosis not present

## 2016-03-29 MED ORDER — NYSTATIN 100000 UNIT/GM EX POWD: Freq: Three times a day (TID) | CUTANEOUS | Status: DC

## 2016-03-29 NOTE — Patient Instructions (Signed)
Keep breast dry Try nystatin Physical in 1 year, pap in 3 years

## 2016-03-29 NOTE — Progress Notes (Signed)
Patient ID: Erica Farley, female   DOB: 11-15-1985, 30 y.o.   MRN: [id number] History of Present Illness: Erica Farley is a 30 year old white female, married in for a well woman gyn exam and pap. She had large breasts and sweats in the summer, she is feeling better since starting the synthroid. She is using condoms.   Current Medications, Allergies, Past Medical History, Past Surgical History, Family History and Social History were reviewed in [employer] record.     Review of Systems: Patient denies any headaches, hearing loss, fatigue, blurred vision, shortness of breath, chest pain, abdominal pain, problems with bowel movements, urination, or intercourse. No joint pain or mood swings. See HPI for positives.  Physical Exam:BP 118/80 mmHg  Pulse 82  Ht 5' 5.25" (1.657 m)  Wt 195 lb (88.451 kg)  BMI 32.21 kg/m2  LMP 03/23/2016 General:  Well developed, well nourished, no acute distress Skin:  Warm and dry Neck:  Midline trachea, normal thyroid, good ROM, no lymphadenopathy Lungs; Clear to auscultation bilaterally Breast:  No dominant palpable mass, retraction, or nipple discharge, large and heavy Cardiovascular: Regular rate and rhythm Abdomen:  Soft, non tender, no hepatosplenomegaly Pelvic:  External genitalia is normal in appearance, no lesions.  The vagina is normal in appearance. Urethra has no lesions or masses. The cervix is bulbous,pap with HPV performed.  Uterus is felt to be normal size, shape, and contour.  No adnexal masses or tenderness noted.Bladder is non tender, no masses felt. Extremities/musculoskeletal:  No swelling or varicosities noted, no clubbing or cyanosis Psych:  No mood changes, alert and cooperative,seems happy   Impression: Well woman gyn exam and pap Hypothyroidism  Large breasts Anxiety Depression     Plan: Check TSH and free T4 Physical in 1 year, pap in 3 Will talk when labs back about synthroid dose Continue prozac,  has refills and has refill on xanax Rx nystatin powder, #30 gm use tid prn with 3 refills Keep breasts as dry as possible

## 2016-03-30 ENCOUNTER — Telehealth: Payer: Self-pay | Admitting: Adult Health

## 2016-03-30 LAB — TSH: TSH: 3.36 u[IU]/mL (ref 0.450–4.500)

## 2016-03-30 LAB — T4, FREE: Free T4: 1 ng/dL (ref 0.82–1.77)

## 2016-03-30 LAB — CYTOLOGY - PAP

## 2016-03-30 MED ORDER — LEVOTHYROXINE SODIUM 50 MCG PO TABS: 50.0000 ug | ORAL_TABLET | Freq: Every day | ORAL | Status: DC

## 2016-03-30 NOTE — Telephone Encounter (Signed)
Pt aware labs are good, refilled synthroid for 6 months, will check labs in 6 months

## 2016-04-08 ENCOUNTER — Other Ambulatory Visit: Payer: Self-pay | Admitting: Adult Health

## 2016-06-15 ENCOUNTER — Other Ambulatory Visit: Payer: Self-pay | Admitting: Adult Health

## 2016-06-15 NOTE — Telephone Encounter (Signed)
Left message that xanax refilled but it was sent as 0.25 mg

## 2016-07-15 ENCOUNTER — Other Ambulatory Visit: Payer: Self-pay | Admitting: Adult Health

## 2016-07-16 ENCOUNTER — Telehealth: Payer: Self-pay | Admitting: Adult Health

## 2016-07-16 NOTE — Telephone Encounter (Signed)
Pt called stating that she would like a refill of her medication, Pt states that she called her pharmacy and they were suppose to fax the request over. Pt states that she is completely out and she doesn't know how it will affect her if she doesn't take the medication. Please contact pt

## 2016-07-19 NOTE — Telephone Encounter (Signed)
Pt requesting refill on Prozac.

## 2016-08-15 ENCOUNTER — Other Ambulatory Visit: Payer: Self-pay | Admitting: Adult Health

## 2016-09-20 ENCOUNTER — Telehealth: Payer: Self-pay | Admitting: Adult Health

## 2016-09-20 NOTE — Telephone Encounter (Signed)
Pt called stating that she would like a call back from [place], [place] did not state the reason why. Please contact pt

## 2016-09-20 NOTE — Telephone Encounter (Signed)
Pt has stopped xanax, and is on prozac 40 mg and doing well.

## 2016-10-04 ENCOUNTER — Telehealth: Payer: Self-pay | Admitting: Adult Health

## 2016-10-04 MED ORDER — PROMETHAZINE HCL 25 MG PO TABS: 25.0000 mg | ORAL_TABLET | Freq: Four times a day (QID) | ORAL | 1 refills | Status: DC | PRN

## 2016-10-04 NOTE — Telephone Encounter (Signed)
Complaining of nausea, had +HPT 11/29, will rx phenergan, will make appt in January

## 2016-10-04 NOTE — Telephone Encounter (Signed)
Pt called stating that she would like a call back from [place]. Pt did not state the reason why.

## 2016-10-20 ENCOUNTER — Ambulatory Visit (INDEPENDENT_AMBULATORY_CARE_PROVIDER_SITE_OTHER): Payer: BLUE CROSS/BLUE SHIELD | Admitting: Adult Health

## 2016-10-20 ENCOUNTER — Encounter: Payer: Self-pay | Admitting: Adult Health

## 2016-10-20 ENCOUNTER — Encounter (INDEPENDENT_AMBULATORY_CARE_PROVIDER_SITE_OTHER): Payer: Self-pay

## 2016-10-20 VITALS — BP 128/80 | HR 85 | Ht 65.0 in | Wt 196.0 lb

## 2016-10-20 DIAGNOSIS — N926 Irregular menstruation, unspecified: Secondary | ICD-10-CM

## 2016-10-20 DIAGNOSIS — O3680X0 Pregnancy with inconclusive fetal viability, not applicable or unspecified: Secondary | ICD-10-CM | POA: Insufficient documentation

## 2016-10-20 DIAGNOSIS — Z3201 Encounter for pregnancy test, result positive: Secondary | ICD-10-CM

## 2016-10-20 DIAGNOSIS — R110 Nausea: Secondary | ICD-10-CM

## 2016-10-20 DIAGNOSIS — Z3490 Encounter for supervision of normal pregnancy, unspecified, unspecified trimester: Secondary | ICD-10-CM

## 2016-10-20 LAB — POCT URINE PREGNANCY: Preg Test, Ur: POSITIVE — AB

## 2016-10-20 NOTE — Patient Instructions (Signed)
First Trimester of Pregnancy The first trimester of pregnancy is from week 1 until the end of week 12 (months 1 through 3). A week after a sperm fertilizes an egg, the egg will implant on the wall of the uterus. This embryo will begin to develop into a baby. Genes from you and your partner are forming the baby. The female genes determine whether the baby is a boy or a girl. At 6-8 weeks, the eyes and face are formed, and the heartbeat can be seen on ultrasound. At the end of 12 weeks, all the baby's organs are formed.  Now that you are pregnant, you will want to do everything you can to have a healthy baby. Two of the most important things are to get good prenatal care and to follow your health care provider's instructions. Prenatal care is all the medical care you receive before the baby's birth. This care will help prevent, find, and treat any problems during the pregnancy and childbirth. BODY CHANGES Your body goes through many changes during pregnancy. The changes vary from woman to woman.   You may gain or lose a couple of pounds at first.  You may feel sick to your stomach (nauseous) and throw up (vomit). If the vomiting is uncontrollable, call your health care provider.  You may tire easily.  You may develop headaches that can be relieved by medicines approved by your health care provider.  You may urinate more often. Painful urination may mean you have a bladder infection.  You may develop heartburn as a result of your pregnancy.  You may develop constipation because certain hormones are causing the muscles that push waste through your intestines to slow down.  You may develop hemorrhoids or swollen, bulging veins (varicose veins).  Your breasts may begin to grow larger and become tender. Your nipples may stick out more, and the tissue that surrounds them (areola) may become darker.  Your gums may bleed and may be sensitive to brushing and flossing.  Dark spots or blotches (chloasma,  mask of pregnancy) may develop on your face. This will likely fade after the baby is born.  Your menstrual periods will stop.  You may have a loss of appetite.  You may develop cravings for certain kinds of food.  You may have changes in your emotions from day to day, such as being excited to be pregnant or being concerned that something may go wrong with the pregnancy and baby.  You may have more vivid and strange dreams.  You may have changes in your hair. These can include thickening of your hair, rapid growth, and changes in texture. Some women also have hair loss during or after pregnancy, or hair that feels dry or thin. Your hair will most likely return to normal after your baby is born. WHAT TO EXPECT AT YOUR PRENATAL VISITS During a routine prenatal visit:  You will be weighed to make sure you and the baby are growing normally.  Your blood pressure will be taken.  Your abdomen will be measured to track your baby's growth.  The fetal heartbeat will be listened to starting around week 10 or 12 of your pregnancy.  Test results from any previous visits will be discussed. Your health care provider may ask you:  How you are feeling.  If you are feeling the baby move.  If you have had any abnormal symptoms, such as leaking fluid, bleeding, severe headaches, or abdominal cramping.  If you are using any tobacco products,   including cigarettes, chewing tobacco, and electronic cigarettes.  If you have any questions. Other tests that may be performed during your first trimester include:  Blood tests to find your blood type and to check for the presence of any previous infections. They will also be used to check for low iron levels (anemia) and Rh antibodies. Later in the pregnancy, blood tests for diabetes will be done along with other tests if problems develop.  Urine tests to check for infections, diabetes, or protein in the urine.  An ultrasound to confirm the proper growth  and development of the baby.  An amniocentesis to check for possible genetic problems.  Fetal screens for spina bifida and Down syndrome.  You may need other tests to make sure you and the baby are doing well.  HIV (human immunodeficiency virus) testing. Routine prenatal testing includes screening for HIV, unless you choose not to have this test. HOME CARE INSTRUCTIONS  Medicines   Follow your health care provider's instructions regarding medicine use. Specific medicines may be either safe or unsafe to take during pregnancy.  Take your prenatal vitamins as directed.  If you develop constipation, try taking a stool softener if your health care provider approves. Diet   Eat regular, well-balanced meals. Choose a variety of foods, such as meat or vegetable-based protein, fish, milk and low-fat dairy products, vegetables, fruits, and whole grain breads and cereals. Your health care provider will help you determine the amount of weight gain that is right for you.  Avoid raw meat and uncooked cheese. These carry germs that can cause birth defects in the baby.  Eating four or five small meals rather than three large meals a day may help relieve nausea and vomiting. If you start to feel nauseous, eating a few soda crackers can be helpful. Drinking liquids between meals instead of during meals also seems to help nausea and vomiting.  If you develop constipation, eat more high-fiber foods, such as fresh vegetables or fruit and whole grains. Drink enough fluids to keep your urine clear or pale yellow. Activity and Exercise   Exercise only as directed by your health care provider. Exercising will help you:  Control your weight.  Stay in shape.  Be prepared for labor and delivery.  Experiencing pain or cramping in the lower abdomen or low back is a good sign that you should stop exercising. Check with your health care provider before continuing normal exercises.  Try to avoid standing for  long periods of time. Move your legs often if you must stand in one place for a long time.  Avoid heavy lifting.  Wear low-heeled shoes, and practice good posture.  You may continue to have sex unless your health care provider directs you otherwise. Relief of Pain or Discomfort   Wear a good support bra for breast tenderness.   Take warm sitz baths to soothe any pain or discomfort caused by hemorrhoids. Use hemorrhoid cream if your health care provider approves.   Rest with your legs elevated if you have leg cramps or low back pain.  If you develop varicose veins in your legs, wear support hose. Elevate your feet for 15 minutes, 3-4 times a day. Limit salt in your diet. Prenatal Care   Schedule your prenatal visits by the twelfth week of pregnancy. They are usually scheduled monthly at first, then more often in the last 2 months before delivery.  Write down your questions. Take them to your prenatal visits.  Keep all your prenatal  legs, wear support hose. Elevate your feet for 15 minutes, 3-4 times a day. Limit salt in your diet.  Prenatal Care  · Schedule your prenatal visits by the twelfth week of pregnancy. They are usually scheduled monthly at first, then more often in the last 2 months before delivery.  · Write down your questions. Take them to your prenatal visits.  · Keep all your prenatal visits as directed by your health care provider.  Safety  · Wear your seat belt at all times when driving.  · Make a list of emergency phone numbers, including numbers for family, friends, the hospital, and police and fire departments.  General Tips  · Ask your health care provider for a referral to a local prenatal education class. Begin classes no later than at the beginning of month 6 of your pregnancy.  · Ask for help if you have counseling or nutritional needs during pregnancy. Your health care provider can offer advice or refer you to specialists for help with various needs.  · Do not use hot tubs, steam rooms, or saunas.  · Do not douche or use tampons or scented sanitary pads.  · Do not cross your legs for long periods of time.  · Avoid cat litter boxes and soil used by cats. These carry germs that can cause birth defects in the baby and possibly loss of the fetus by miscarriage or stillbirth.   · Avoid all smoking, herbs, alcohol, and medicines not prescribed by your health care provider. Chemicals in these affect the formation and growth of the baby.  · Do not use any tobacco products, including cigarettes, chewing tobacco, and electronic cigarettes. If you need help quitting, ask your health care provider. You may receive counseling support and other resources to help you quit.  · Schedule a dentist appointment. At home, brush your teeth with a soft toothbrush and be gentle when you floss.  SEEK MEDICAL CARE IF:   · You have dizziness.  · You have mild pelvic cramps, pelvic pressure, or nagging pain in the abdominal area.  · You have persistent nausea, vomiting, or diarrhea.  · You have a bad smelling vaginal discharge.  · You have pain with urination.  · You notice increased swelling in your face, hands, legs, or ankles.  SEEK IMMEDIATE MEDICAL CARE IF:   · You have a fever.  · You are leaking fluid from your vagina.  · You have spotting or bleeding from your vagina.  · You have severe abdominal cramping or pain.  · You have rapid weight gain or loss.  · You vomit blood or material that looks like coffee grounds.  · You are exposed to German measles and have never had them.  · You are exposed to fifth disease or chickenpox.  · You develop a severe headache.  · You have shortness of breath.  · You have any kind of trauma, such as from a fall or a car accident.     This information is not intended to replace advice given to you by your health care provider. Make sure you discuss any questions you have with your health care provider.     Document Released: 09/28/2001 Document Revised: 10/25/2014 Document Reviewed: 08/14/2013  Elsevier Interactive Patient Education ©2017 [employer] Inc[employer]

## 2016-10-20 NOTE — Progress Notes (Signed)
Subjective:     Patient ID: Erica Farley, female   DOB: 12-12-1985, 31 y.o.   MRN: [id number]  HPI Erica Farley is a 31 year old white female, married in for UPT, has missed a period and had +HPT. Has had nausea but is better. Had prior C-section for breech, and is interested in VBAC.Already on prenatal vitamins and has phenergan.  Review of Systems +missed period +nasuea, but better Reviewed past medical,surgical, social and family history. Reviewed medications and allergies.     Objective:   Physical Exam BP 128/80 (BP Location: Left Arm, Patient Position: Sitting, Cuff Size: Normal)   Pulse 85   Ht 5\' 5"  (1.651 m)   Wt 196 lb (88.9 kg)   LMP 08/12/2016 (Exact Date)   BMI 32.62 kg/m UPT +, about 9+6 weeks by LMP with EDD 05/19/17,Skin warm and dry. Neck: mid line trachea, normal thyroid, good ROM, no lymphadenopathy noted. Lungs: clear to ausculation bilaterally. Cardiovascular: regular rate and rhythm.Abdoemn is soft and non tender.US shows IUP with YS and active fetal pole with FHM and rate of 170. PHQ 2 score 0.    Assessment:     1. Pregnancy examination or test, positive result   2. Pregnancy, unspecified gestational age   70. Encounter to determine fetal viability of pregnancy, single or unspecified fetus       Plan:     Return in 1 week for dating [place] and intake Review handout on first trimester

## 2016-10-27 ENCOUNTER — Ambulatory Visit (INDEPENDENT_AMBULATORY_CARE_PROVIDER_SITE_OTHER): Payer: BLUE CROSS/BLUE SHIELD

## 2016-10-27 ENCOUNTER — Encounter: Payer: Self-pay | Admitting: *Deleted

## 2016-10-27 ENCOUNTER — Ambulatory Visit (INDEPENDENT_AMBULATORY_CARE_PROVIDER_SITE_OTHER): Payer: BLUE CROSS/BLUE SHIELD | Admitting: *Deleted

## 2016-10-27 VITALS — BP 124/82 | HR 85 | Wt 195.0 lb

## 2016-10-27 DIAGNOSIS — Z3A10 10 weeks gestation of pregnancy: Secondary | ICD-10-CM | POA: Diagnosis not present

## 2016-10-27 DIAGNOSIS — Z331 Pregnant state, incidental: Secondary | ICD-10-CM

## 2016-10-27 DIAGNOSIS — Z1389 Encounter for screening for other disorder: Secondary | ICD-10-CM

## 2016-10-27 DIAGNOSIS — O3680X0 Pregnancy with inconclusive fetal viability, not applicable or unspecified: Secondary | ICD-10-CM | POA: Diagnosis not present

## 2016-10-27 DIAGNOSIS — O3491 Maternal care for abnormality of pelvic organ, unspecified, first trimester: Secondary | ICD-10-CM | POA: Diagnosis not present

## 2016-10-27 DIAGNOSIS — Z3481 Encounter for supervision of other normal pregnancy, first trimester: Secondary | ICD-10-CM | POA: Diagnosis not present

## 2016-10-27 DIAGNOSIS — Z98891 History of uterine scar from previous surgery: Secondary | ICD-10-CM

## 2016-10-27 DIAGNOSIS — O34219 Maternal care for unspecified type scar from previous cesarean delivery: Secondary | ICD-10-CM

## 2016-10-27 DIAGNOSIS — Z3490 Encounter for supervision of normal pregnancy, unspecified, unspecified trimester: Secondary | ICD-10-CM | POA: Insufficient documentation

## 2016-10-27 LAB — POCT URINALYSIS DIPSTICK
Blood, UA: NEGATIVE
Glucose, UA: NEGATIVE
Ketones, UA: NEGATIVE
Leukocytes, UA: NEGATIVE
Nitrite, UA: NEGATIVE
Protein, UA: NEGATIVE

## 2016-10-27 NOTE — Progress Notes (Signed)
[place] 10 wks,single IUP pos fht 175 bpm,normal left ovary,simple right corpus luteal cyst 5.1 x 3.3 x 4.2 cm,crl 33.1 mm

## 2016-10-27 NOTE — Progress Notes (Deleted)
ob

## 2016-10-27 NOTE — Progress Notes (Signed)
Erica Farley is a 31 y.o. G54P1001 female here today for initial OB intake/educational visit with RN  Patient's medical, surgical, and obstetrical history obtained and reviewed.  Current medications and allergies also reviewed.   Dating ultrasound today revealed GA of [redacted]wk based on U/S. EDC 05/25/17   BP 124/82   Pulse 85   Wt 195 lb (88.5 kg)   LMP 08/12/2016 (Exact Date)   BMI 32.45 kg/m   Patient Active Problem List   Diagnosis Date Noted  . Encounter for supervision of other normal pregnancy 10/27/2016  . History of cesarean delivery 10/27/2016  . Encounter to determine fetal viability of pregnancy 10/20/2016  . Hypothyroid 02/10/2016  . Depression 02/09/2016  . Body aches 02/09/2016  . Menstrual headache 03/06/2015  . Pain in hand 03/06/2015  . Neck pain, bilateral posterior 09/06/2014  . Large breasts 09/06/2014  . Contraceptive management 07/23/2014  . Chronic headaches 07/23/2014  . Anxiety 06/10/2014   Past Medical History:  Diagnosis Date  . Anxiety   . Body aches 02/09/2016  . Chronic headaches 07/23/2014  . Contraceptive management 07/23/2014  . Depression 02/09/2016  . Headache(784.0)    due to TMJ- left side   . Hypothyroid 02/10/2016  . Large breasts 09/06/2014  . Menstrual headache 03/06/2015  . Neck pain, bilateral posterior 09/06/2014  . Pain in hand 03/06/2015   Past Surgical History:  Procedure Laterality Date  . bladder stem     . CESAREAN SECTION  11/16/2012   Procedure: CESAREAN SECTION;  Surgeon: Florian Buff, MD;  Location: [facility];  Service: Obstetrics;  Laterality: N/A;  . WISDOM TOOTH EXTRACTION     OB History    Gravida Para Term Preterm AB Living   2 1 1     1    SAB TAB Ectopic Multiple Live Births           1      She is taking prenatal vitamins PN1 labs drawn Baby scripts and mychart activated  Reviewed recommended weight gain based on pre-gravid BMI  Genetic Screening discussed Integrated Screen: declined Cystic fibrosis  screening discussed declined  Face-to-face time at least 30 minutes. 50% or more of this visit was spent in counseling and coordination of care.  Return in about 3 weeks (around 11/17/2016) for New OB.   Kristeen Miss Erica Bifulco RN-C 10/27/2016 3:48 PM

## 2016-10-28 LAB — URINALYSIS, ROUTINE W REFLEX MICROSCOPIC
Bilirubin, UA: NEGATIVE
Glucose, UA: NEGATIVE
Ketones, UA: NEGATIVE
Leukocytes, UA: NEGATIVE
Nitrite, UA: NEGATIVE
Protein, UA: NEGATIVE
RBC, UA: NEGATIVE
Specific Gravity, UA: 1.022 (ref 1.005–1.030)
Urobilinogen, Ur: 0.2 mg/dL (ref 0.2–1.0)
pH, UA: 5.5 (ref 5.0–7.5)

## 2016-10-28 LAB — CBC
Hematocrit: 40.7 % (ref 34.0–46.6)
Hemoglobin: 13.7 g/dL (ref 11.1–15.9)
MCH: 31.5 pg (ref 26.6–33.0)
MCHC: 33.7 g/dL (ref 31.5–35.7)
MCV: 94 fL (ref 79–97)
Platelets: 332 10*3/uL (ref 150–379)
RBC: 4.35 x10E6/uL (ref 3.77–5.28)
RDW: 14.2 % (ref 12.3–15.4)
WBC: 12.2 10*3/uL — ABNORMAL HIGH (ref 3.4–10.8)

## 2016-10-28 LAB — RPR: RPR Ser Ql: NONREACTIVE

## 2016-10-28 LAB — PMP SCREEN PROFILE (10S), URINE
Amphetamine Screen, Ur: NEGATIVE ng/mL
Barbiturate Screen, Ur: NEGATIVE ng/mL
Benzodiazepine Screen, Urine: NEGATIVE ng/mL
Cannabinoids Ur Ql Scn: NEGATIVE ng/mL
Cocaine(Metab.)Screen, Urine: NEGATIVE ng/mL
Creatinine(Crt), U: 109.2 mg/dL (ref 20.0–300.0)
Methadone Scn, Ur: NEGATIVE ng/mL
Opiate Scrn, Ur: NEGATIVE ng/mL
Oxycodone+Oxymorphone Ur Ql Scn: NEGATIVE ng/mL
PCP Scrn, Ur: NEGATIVE ng/mL
Ph of Urine: 5.5 (ref 4.5–8.9)
Propoxyphene, Screen: NEGATIVE ng/mL

## 2016-10-28 LAB — ABO/RH: Rh Factor: POSITIVE

## 2016-10-28 LAB — GC/CHLAMYDIA PROBE AMP
Chlamydia trachomatis, NAA: NEGATIVE
Neisseria gonorrhoeae by PCR: NEGATIVE

## 2016-10-28 LAB — HEPATITIS B SURFACE ANTIGEN: Hepatitis B Surface Ag: NEGATIVE

## 2016-10-28 LAB — RUBELLA SCREEN: Rubella Antibodies, IGG: 9.44 index (ref >0.99)

## 2016-10-28 LAB — ANTIBODY SCREEN: Antibody Screen: NEGATIVE

## 2016-10-28 LAB — HIV ANTIBODY (ROUTINE TESTING W REFLEX): HIV Screen 4th Generation wRfx: NONREACTIVE

## 2016-10-28 LAB — VARICELLA ZOSTER ANTIBODY, IGG: Varicella zoster IgG: 3427 index (ref >165)

## 2016-10-29 LAB — URINE CULTURE: Organism ID, Bacteria: NO GROWTH

## 2016-11-01 ENCOUNTER — Other Ambulatory Visit: Payer: Self-pay | Admitting: Adult Health

## 2016-11-03 ENCOUNTER — Other Ambulatory Visit: Payer: BLUE CROSS/BLUE SHIELD

## 2016-11-17 ENCOUNTER — Encounter: Payer: Self-pay | Admitting: Advanced Practice Midwife

## 2016-11-17 ENCOUNTER — Ambulatory Visit (INDEPENDENT_AMBULATORY_CARE_PROVIDER_SITE_OTHER): Payer: BLUE CROSS/BLUE SHIELD | Admitting: Advanced Practice Midwife

## 2016-11-17 VITALS — BP 122/80 | HR 76 | Wt 193.0 lb

## 2016-11-17 DIAGNOSIS — Z23 Encounter for immunization: Secondary | ICD-10-CM

## 2016-11-17 DIAGNOSIS — Z3481 Encounter for supervision of other normal pregnancy, first trimester: Secondary | ICD-10-CM

## 2016-11-17 DIAGNOSIS — E039 Hypothyroidism, unspecified: Secondary | ICD-10-CM

## 2016-11-17 DIAGNOSIS — O34219 Maternal care for unspecified type scar from previous cesarean delivery: Secondary | ICD-10-CM

## 2016-11-17 DIAGNOSIS — Z3A13 13 weeks gestation of pregnancy: Secondary | ICD-10-CM

## 2016-11-17 DIAGNOSIS — Z331 Pregnant state, incidental: Secondary | ICD-10-CM

## 2016-11-17 DIAGNOSIS — Z1389 Encounter for screening for other disorder: Secondary | ICD-10-CM

## 2016-11-17 DIAGNOSIS — O99281 Endocrine, nutritional and metabolic diseases complicating pregnancy, first trimester: Secondary | ICD-10-CM

## 2016-11-17 LAB — POCT URINALYSIS DIPSTICK
Blood, UA: NEGATIVE
Glucose, UA: NEGATIVE
Ketones, UA: NEGATIVE
Leukocytes, UA: NEGATIVE
Nitrite, UA: NEGATIVE

## 2016-11-17 NOTE — Patient Instructions (Signed)
 First Trimester of Pregnancy The first trimester of pregnancy is from week 1 until the end of week 12 (months 1 through 3). A week after a sperm fertilizes an egg, the egg will implant on the wall of the uterus. This embryo will begin to develop into a baby. Genes from you and your partner are forming the baby. The female genes determine whether the baby is a boy or a girl. At 6-8 weeks, the eyes and face are formed, and the heartbeat can be seen on ultrasound. At the end of 12 weeks, all the baby's organs are formed.  Now that you are pregnant, you will want to do everything you can to have a healthy baby. Two of the most important things are to get good prenatal care and to follow your health care provider's instructions. Prenatal care is all the medical care you receive before the baby's birth. This care will help prevent, find, and treat any problems during the pregnancy and childbirth. BODY CHANGES Your body goes through many changes during pregnancy. The changes vary from woman to woman.   You may gain or lose a couple of pounds at first.  You may feel sick to your stomach (nauseous) and throw up (vomit). If the vomiting is uncontrollable, call your health care provider.  You may tire easily.  You may develop headaches that can be relieved by medicines approved by your health care provider.  You may urinate more often. Painful urination may mean you have a bladder infection.  You may develop heartburn as a result of your pregnancy.  You may develop constipation because certain hormones are causing the muscles that push waste through your intestines to slow down.  You may develop hemorrhoids or swollen, bulging veins (varicose veins).  Your breasts may begin to grow larger and become tender. Your nipples may stick out more, and the tissue that surrounds them (areola) may become darker.  Your gums may bleed and may be sensitive to brushing and flossing.  Dark spots or blotches  (chloasma, mask of pregnancy) may develop on your face. This will likely fade after the baby is born.  Your menstrual periods will stop.  You may have a loss of appetite.  You may develop cravings for certain kinds of food.  You may have changes in your emotions from day to day, such as being excited to be pregnant or being concerned that something may go wrong with the pregnancy and baby.  You may have more vivid and strange dreams.  You may have changes in your hair. These can include thickening of your hair, rapid growth, and changes in texture. Some women also have hair loss during or after pregnancy, or hair that feels dry or thin. Your hair will most likely return to normal after your baby is born. WHAT TO EXPECT AT YOUR PRENATAL VISITS During a routine prenatal visit:  You will be weighed to make sure you and the baby are growing normally.  Your blood pressure will be taken.  Your abdomen will be measured to track your baby's growth.  The fetal heartbeat will be listened to starting around week 10 or 12 of your pregnancy.  Test results from any previous visits will be discussed. Your health care provider may ask you:  How you are feeling.  If you are feeling the baby move.  If you have had any abnormal symptoms, such as leaking fluid, bleeding, severe headaches, or abdominal cramping.  If you have any questions. Other   tests that may be performed during your first trimester include:  Blood tests to find your blood type and to check for the presence of any previous infections. They will also be used to check for low iron levels (anemia) and Rh antibodies. Later in the pregnancy, blood tests for diabetes will be done along with other tests if problems develop.  Urine tests to check for infections, diabetes, or protein in the urine.  An ultrasound to confirm the proper growth and development of the baby.  An amniocentesis to check for possible genetic problems.  Fetal  screens for spina bifida and Down syndrome.  You may need other tests to make sure you and the baby are doing well. HOME CARE INSTRUCTIONS  Medicines  Follow your health care provider's instructions regarding medicine use. Specific medicines may be either safe or unsafe to take during pregnancy.  Take your prenatal vitamins as directed.  If you develop constipation, try taking a stool softener if your health care provider approves. Diet  Eat regular, well-balanced meals. Choose a variety of foods, such as meat or vegetable-based protein, fish, milk and low-fat dairy products, vegetables, fruits, and whole grain breads and cereals. Your health care provider will help you determine the amount of weight gain that is right for you.  Avoid raw meat and uncooked cheese. These carry germs that can cause birth defects in the baby.  Eating four or five small meals rather than three large meals a day may help relieve nausea and vomiting. If you start to feel nauseous, eating a few soda crackers can be helpful. Drinking liquids between meals instead of during meals also seems to help nausea and vomiting.  If you develop constipation, eat more high-fiber foods, such as fresh vegetables or fruit and whole grains. Drink enough fluids to keep your urine clear or pale yellow. Activity and Exercise  Exercise only as directed by your health care provider. Exercising will help you:  Control your weight.  Stay in shape.  Be prepared for labor and delivery.  Experiencing pain or cramping in the lower abdomen or low back is a good sign that you should stop exercising. Check with your health care provider before continuing normal exercises.  Try to avoid standing for long periods of time. Move your legs often if you must stand in one place for a long time.  Avoid heavy lifting.  Wear low-heeled shoes, and practice good posture.  You may continue to have sex unless your health care provider directs you  otherwise. Relief of Pain or Discomfort  Wear a good support bra for breast tenderness.   Take warm sitz baths to soothe any pain or discomfort caused by hemorrhoids. Use hemorrhoid cream if your health care provider approves.   Rest with your legs elevated if you have leg cramps or low back pain.  If you develop varicose veins in your legs, wear support hose. Elevate your feet for 15 minutes, 3-4 times a day. Limit salt in your diet. Prenatal Care  Schedule your prenatal visits by the twelfth week of pregnancy. They are usually scheduled monthly at first, then more often in the last 2 months before delivery.  Write down your questions. Take them to your prenatal visits.  Keep all your prenatal visits as directed by your health care provider. Safety  Wear your seat belt at all times when driving.  Make a list of emergency phone numbers, including numbers for family, friends, the hospital, and police and fire departments. General   Tips  Ask your health care provider for a referral to a local prenatal education class. Begin classes no later than at the beginning of month 6 of your pregnancy.  Ask for help if you have counseling or nutritional needs during pregnancy. Your health care provider can offer advice or refer you to specialists for help with various needs.  Do not use hot tubs, steam rooms, or saunas.  Do not douche or use tampons or scented sanitary pads.  Do not cross your legs for long periods of time.  Avoid cat litter boxes and soil used by cats. These carry germs that can cause birth defects in the baby and possibly loss of the fetus by miscarriage or stillbirth.  Avoid all smoking, herbs, alcohol, and medicines not prescribed by your health care provider. Chemicals in these affect the formation and growth of the baby.  Schedule a dentist appointment. At home, brush your teeth with a soft toothbrush and be gentle when you floss. SEEK MEDICAL CARE IF:   You have  dizziness.  You have mild pelvic cramps, pelvic pressure, or nagging pain in the abdominal area.  You have persistent nausea, vomiting, or diarrhea.  You have a bad smelling vaginal discharge.  You have pain with urination.  You notice increased swelling in your face, hands, legs, or ankles. SEEK IMMEDIATE MEDICAL CARE IF:   You have a fever.  You are leaking fluid from your vagina.  You have spotting or bleeding from your vagina.  You have severe abdominal cramping or pain.  You have rapid weight gain or loss.  You vomit blood or material that looks like coffee grounds.  You are exposed to German measles and have never had them.  You are exposed to fifth disease or chickenpox.  You develop a severe headache.  You have shortness of breath.  You have any kind of trauma, such as from a fall or a car accident. Document Released: 09/28/2001 Document Revised: 02/18/2014 Document Reviewed: 08/14/2013 Exit[employer] Patient Information 2015 [employer]. This information is not intended to replace advice given to you by your health care provider. Make sure you discuss any questions you have with your health care provider.   Nausea & Vomiting  Have saltine crackers or pretzels by your bed and eat a few bites before you raise your head out of bed in the morning  Eat small frequent meals throughout the day instead of large meals  Drink plenty of fluids throughout the day to stay hydrated, just don't drink a lot of fluids with your meals.  This can make your stomach fill up faster making you feel sick  Do not brush your teeth right after you eat  Products with real ginger are good for nausea, like ginger ale and ginger hard candy Make sure it says made with real ginger!  Sucking on sour candy like lemon heads is also good for nausea  If your prenatal vitamins make you nauseated, take them at night so you will sleep through the nausea  Sea Bands  If you feel like you need  medicine for the nausea & vomiting please let us know  If you are unable to keep any fluids or food down please let us know   Constipation  Drink plenty of fluid, preferably water, throughout the day  Eat foods high in fiber such as fruits, vegetables, and grains  Exercise, such as walking, is a good way to keep your bowels regular  Drink warm fluids, especially warm   prune juice, or decaf coffee  Eat a 1/2 cup of real oatmeal (not instant), 1/2 cup applesauce, and 1/2-1 cup warm prune juice every day  If needed, you may take Colace (docusate sodium) stool softener once or twice a day to help keep the stool soft. If you are pregnant, wait until you are out of your first trimester (12-14 weeks of pregnancy)  If you still are having problems with constipation, you may take Miralax once daily as needed to help keep your bowels regular.  If you are pregnant, wait until you are out of your first trimester (12-14 weeks of pregnancy)  Safe Medications in Pregnancy   Acne: Benzoyl Peroxide Salicylic Acid  Backache/Headache: Tylenol: 2 regular strength every 4 hours OR              2 Extra strength every 6 hours  Colds/Coughs/Allergies: Benadryl (alcohol free) 25 mg every 6 hours as needed Breath right strips Claritin Cepacol throat lozenges Chloraseptic throat spray Cold-Eeze- up to three times per day Cough drops, alcohol free Flonase (by prescription only) Guaifenesin Mucinex Robitussin DM (plain only, alcohol free) Saline nasal spray/drops Sudafed (pseudoephedrine) & Actifed ** use only after [redacted] weeks gestation and if you do not have high blood pressure Tylenol Vicks Vaporub Zinc lozenges Zyrtec   Constipation: Colace Ducolax suppositories Fleet enema Glycerin suppositories Metamucil Milk of magnesia Miralax Senokot Smooth move tea  Diarrhea: Kaopectate Imodium A-D  *NO pepto Bismol  Hemorrhoids: Anusol Anusol HC Preparation  H Tucks  Indigestion: Tums Maalox Mylanta Zantac  Pepcid  Insomnia: Benadryl (alcohol free) 25mg every 6 hours as needed Tylenol PM Unisom, no Gelcaps  Leg Cramps: Tums MagGel  Nausea/Vomiting:  Bonine Dramamine Emetrol Ginger extract Sea bands Meclizine  Nausea medication to take during pregnancy:  Unisom (doxylamine succinate 25 mg tablets) Take one tablet daily at bedtime. If symptoms are not adequately controlled, the dose can be increased to a maximum recommended dose of two tablets daily (1/2 tablet in the morning, 1/2 tablet mid-afternoon and one at bedtime). Vitamin B6 100mg tablets. Take one tablet twice a day (up to 200 mg per day).  Skin Rashes: Aveeno products Benadryl cream or 25mg every 6 hours as needed Calamine Lotion 1% cortisone cream  Yeast infection: Gyne-lotrimin 7 Monistat 7   **If taking multiple medications, please check labels to avoid duplicating the same active ingredients **take medication as directed on the label ** Do not exceed 4000 mg of tylenol in 24 hours **Do not take medications that contain aspirin or ibuprofen      

## 2016-11-17 NOTE — Progress Notes (Signed)
Subjective:    Erica Farley is a G2P1001 [redacted]w[redacted]d being seen today for her first obstetrical visit.  Her obstetrical history is significant for CS for breech.  Thinking about . VBAC Pregnancy history fully reviewed.  Patient reports fatigue, restless legs, craves pickle juice  Vitals:   11/17/16 0838  BP: 122/80  Pulse: 76  Weight: 193 lb (87.5 kg)    HISTORY: OB History  Gravida Para Term Preterm AB Living  2 1 1     1   SAB TAB Ectopic Multiple Live Births          1    # Outcome Date GA Lbr Len/2nd Weight Sex Delivery Anes PTL Lv  2 Current           1 Term 11/16/12 [redacted]w[redacted]d  8 lb 10 oz (3.912 kg) M CS-LTranv Spinal N LIV     Complications: Malpresentation of fetus     Past Medical History:  Diagnosis Date  . Anxiety   . Body aches 02/09/2016  . Chronic headaches 07/23/2014  . Contraceptive management 07/23/2014  . Depression 02/09/2016  . Headache(784.0)    due to TMJ- left side   . Hypothyroid 02/10/2016  . Large breasts 09/06/2014  . Menstrual headache 03/06/2015  . Neck pain, bilateral posterior 09/06/2014  . Pain in hand 03/06/2015   Past Surgical History:  Procedure Laterality Date  . bladder stem     . CESAREAN SECTION  11/16/2012   Procedure: CESAREAN SECTION;  Surgeon: Florian Buff, MD;  Location: [facility];  Service: Obstetrics;  Laterality: N/A;  . WISDOM TOOTH EXTRACTION     Family History  Problem Relation Age of Onset  . Depression Maternal Grandmother   . Fibromyalgia Maternal Grandmother   . Cancer Paternal Grandfather     lung  . Fibromyalgia Mother   . COPD Maternal Grandfather   . Bipolar disorder Paternal Grandmother   . Diabetes Paternal Grandmother   . Suicidality Paternal Aunt             Exam                                      System:     Skin: normal coloration and turgor, no rashes    Neurologic: oriented, normal, normal mood   Extremities: normal strength, tone, and muscle mass   HEENT PERRLA   Mouth/Teeth mucous  membranes moist, normal dentition   Neck supple and no masses   Cardiovascular: regular rate and rhythm   Respiratory:  appears well, vitals normal, no respiratory distress, acyanotic   Abdomen: soft, non-tender;  FHR: 160          Assessment:    Pregnancy: G2P1001 Patient Active Problem List   Diagnosis Date Noted  . Encounter for supervision of other normal pregnancy 10/27/2016  . History of cesarean delivery 10/27/2016  . Encounter to determine fetal viability of pregnancy 10/20/2016  . Hypothyroid 02/10/2016  . Depression 02/09/2016  . Body aches 02/09/2016  . Menstrual headache 03/06/2015  . Pain in hand 03/06/2015  . Neck pain, bilateral posterior 09/06/2014  . Large breasts 09/06/2014  . Contraceptive management 07/23/2014  . Chronic headaches 07/23/2014  . Anxiety 06/10/2014        Plan:      Continue prenatal vitamins  Problem list reviewed and updated  Reviewed n/v relief measures and warning s/s to report  Reviewed  recommended weight gain based on pre-gravid BMI  Encouraged well-balanced diet Genetic Screening discussed Integrated Screen: declined.  Ultrasound discussed; fetal survey: requested.  Return in about 4 weeks (around 12/15/2016) for [place].  CRESENZO-DISHMAN,Lewellyn Fultz 11/17/2016

## 2016-11-18 ENCOUNTER — Encounter: Payer: Self-pay | Admitting: Advanced Practice Midwife

## 2016-11-18 ENCOUNTER — Other Ambulatory Visit: Payer: Self-pay | Admitting: Advanced Practice Midwife

## 2016-11-18 DIAGNOSIS — E039 Hypothyroidism, unspecified: Secondary | ICD-10-CM

## 2016-11-18 LAB — TSH: TSH: 5.58 u[IU]/mL — ABNORMAL HIGH (ref 0.450–4.500)

## 2016-11-18 NOTE — Progress Notes (Signed)
TSH 5.58  Increase synthroid to 135mcg/day, recheck 6 weeks

## 2016-11-19 ENCOUNTER — Ambulatory Visit (INDEPENDENT_AMBULATORY_CARE_PROVIDER_SITE_OTHER): Payer: BLUE CROSS/BLUE SHIELD | Admitting: Obstetrics & Gynecology

## 2016-11-19 ENCOUNTER — Encounter: Payer: Self-pay | Admitting: Obstetrics & Gynecology

## 2016-11-19 VITALS — BP 122/80 | HR 80 | Wt 192.0 lb

## 2016-11-19 DIAGNOSIS — Z1389 Encounter for screening for other disorder: Secondary | ICD-10-CM

## 2016-11-19 DIAGNOSIS — Z331 Pregnant state, incidental: Secondary | ICD-10-CM

## 2016-11-19 DIAGNOSIS — O26852 Spotting complicating pregnancy, second trimester: Secondary | ICD-10-CM

## 2016-11-19 DIAGNOSIS — O26851 Spotting complicating pregnancy, first trimester: Secondary | ICD-10-CM

## 2016-11-19 DIAGNOSIS — Z3A13 13 weeks gestation of pregnancy: Secondary | ICD-10-CM

## 2016-11-19 LAB — POCT URINALYSIS DIPSTICK
Glucose, UA: NEGATIVE
Ketones, UA: NEGATIVE
Leukocytes, UA: NEGATIVE
Nitrite, UA: NEGATIVE
Protein, UA: NEGATIVE

## 2016-11-19 NOTE — Progress Notes (Signed)
Work in ob visit  [redacted]w[redacted]d Estimated Date of Delivery: 05/25/17  complaining of some pink spotting this am which has resolved, was present when wiped in the bathroom Some mild cramping No sex in a while  Exam SSE no blood in vault very slight pinkish discharge Cervix otherwise normal no lesion No abnormal discharge FHR 153  Insignificant spotting in an early pregnancy  Pelvic rest for a few days  Keep scheduled     Face to face time:  15 minutes  Greater than 50% of the visit time was spent in counseling and coordination of care with the patient.  The summary and outline of the counseling and care coordination is summarized in the note above.   All questions were answered.

## 2016-12-03 ENCOUNTER — Telehealth: Payer: Self-pay | Admitting: *Deleted

## 2016-12-03 ENCOUNTER — Telehealth: Payer: Self-pay | Admitting: Advanced Practice Midwife

## 2016-12-03 MED ORDER — LEVOTHYROXINE SODIUM 100 MCG PO TABS: 100.0000 ug | ORAL_TABLET | Freq: Every day | ORAL | 6 refills | Status: DC

## 2016-12-03 NOTE — Telephone Encounter (Signed)
Refilled synthroid

## 2016-12-03 NOTE — Telephone Encounter (Signed)
Left message x 1. JSY 

## 2016-12-03 NOTE — Telephone Encounter (Signed)
Spoke with pt. Pt is on Levothyroxine 100 mcg, takes 1 daily. Can you send refill to pharmacy? Thanks!! [place]

## 2016-12-03 NOTE — Telephone Encounter (Signed)
Spoke with pt. Pt is on Levothyroxine 100 mcg. Message routed to [facility] to refill med. [place]

## 2016-12-15 ENCOUNTER — Ambulatory Visit (INDEPENDENT_AMBULATORY_CARE_PROVIDER_SITE_OTHER): Payer: BLUE CROSS/BLUE SHIELD | Admitting: Advanced Practice Midwife

## 2016-12-15 ENCOUNTER — Encounter: Payer: Self-pay | Admitting: Advanced Practice Midwife

## 2016-12-15 VITALS — BP 100/70 | HR 92 | Wt 194.0 lb

## 2016-12-15 DIAGNOSIS — Z363 Encounter for antenatal screening for malformations: Secondary | ICD-10-CM

## 2016-12-15 DIAGNOSIS — Z331 Pregnant state, incidental: Secondary | ICD-10-CM

## 2016-12-15 DIAGNOSIS — Z3A17 17 weeks gestation of pregnancy: Secondary | ICD-10-CM

## 2016-12-15 DIAGNOSIS — Z3482 Encounter for supervision of other normal pregnancy, second trimester: Secondary | ICD-10-CM

## 2016-12-15 DIAGNOSIS — Z1389 Encounter for screening for other disorder: Secondary | ICD-10-CM

## 2016-12-15 LAB — POCT URINALYSIS DIPSTICK
Blood, UA: NEGATIVE
Glucose, UA: NEGATIVE
Leukocytes, UA: NEGATIVE
Nitrite, UA: NEGATIVE

## 2016-12-15 NOTE — Progress Notes (Signed)
G2P1001 [redacted]w[redacted]d Estimated Date of Delivery: 05/25/17  Blood pressure 100/70, pulse 92, weight 194 lb (88 kg), last menstrual period 08/12/2016.   BP weight and urine results all reviewed and noted.  Please refer to the obstetrical flow sheet for the fundal height and fetal heart rate documentation:  Patient denies any bleeding and no rupture of membranes symptoms or regular contractions. Patient is without complaints. All questions were answered.  Orders Placed This Encounter  Procedures  . US OB Comp + 14 Wk  . POCT urinalysis dipstick    Plan:  Continued routine obstetrical care,   Return in about 2 weeks (around 12/29/2016) for [place], [id number].

## 2016-12-15 NOTE — Patient Instructions (Signed)

## 2016-12-29 ENCOUNTER — Ambulatory Visit (INDEPENDENT_AMBULATORY_CARE_PROVIDER_SITE_OTHER): Payer: BLUE CROSS/BLUE SHIELD

## 2016-12-29 ENCOUNTER — Ambulatory Visit (INDEPENDENT_AMBULATORY_CARE_PROVIDER_SITE_OTHER): Payer: BLUE CROSS/BLUE SHIELD | Admitting: Advanced Practice Midwife

## 2016-12-29 ENCOUNTER — Encounter: Payer: Self-pay | Admitting: Advanced Practice Midwife

## 2016-12-29 VITALS — BP 128/64 | HR 80 | Wt 191.0 lb

## 2016-12-29 DIAGNOSIS — O99282 Endocrine, nutritional and metabolic diseases complicating pregnancy, second trimester: Secondary | ICD-10-CM

## 2016-12-29 DIAGNOSIS — Z363 Encounter for antenatal screening for malformations: Secondary | ICD-10-CM | POA: Diagnosis not present

## 2016-12-29 DIAGNOSIS — Z331 Pregnant state, incidental: Secondary | ICD-10-CM

## 2016-12-29 DIAGNOSIS — E039 Hypothyroidism, unspecified: Secondary | ICD-10-CM

## 2016-12-29 DIAGNOSIS — Z3A19 19 weeks gestation of pregnancy: Secondary | ICD-10-CM

## 2016-12-29 DIAGNOSIS — O321XX1 Maternal care for breech presentation, fetus 1: Secondary | ICD-10-CM

## 2016-12-29 DIAGNOSIS — O3482 Maternal care for other abnormalities of pelvic organs, second trimester: Secondary | ICD-10-CM

## 2016-12-29 DIAGNOSIS — Z3482 Encounter for supervision of other normal pregnancy, second trimester: Secondary | ICD-10-CM

## 2016-12-29 DIAGNOSIS — Z1389 Encounter for screening for other disorder: Secondary | ICD-10-CM

## 2016-12-29 DIAGNOSIS — Z98891 History of uterine scar from previous surgery: Secondary | ICD-10-CM

## 2016-12-29 LAB — POCT URINALYSIS DIPSTICK
Glucose, UA: NEGATIVE
Leukocytes, UA: NEGATIVE
Nitrite, UA: NEGATIVE
Protein, UA: NEGATIVE

## 2016-12-29 MED ORDER — AMOXICILLIN 500 MG PO CAPS: 500.0000 mg | ORAL_CAPSULE | Freq: Three times a day (TID) | ORAL | 0 refills | Status: DC

## 2016-12-29 NOTE — Progress Notes (Signed)
[place] 19 wks,breech,ant pl gr 0,cx 3.6 cm,svp of fluid 5.6 cm,right corpus luteal cyst 3.1 x 2 x 2.5 cm,normal left ovary,fhr 138 bpm,efw 283 g,anatomy complete,no obvious abnormalities

## 2016-12-29 NOTE — Patient Instructions (Addendum)
Second Trimester of Pregnancy The second trimester is from week 14 through week 27 (months 4 through 6). The second trimester is often a time when you feel your best. Your body has adjusted to being pregnant, and you begin to feel better physically. Usually, morning sickness has lessened or quit completely, you may have more energy, and you may have an increase in appetite. The second trimester is also a time when the fetus is growing rapidly. At the end of the sixth month, the fetus is about 9 inches long and weighs about 1 pounds. You will likely begin to feel the baby move (quickening) between 16 and 20 weeks of pregnancy. Body changes during your second trimester Your body continues to go through many changes during your second trimester. The changes vary from woman to woman.  Your weight will continue to increase. You will notice your lower abdomen bulging out.  You may begin to get stretch marks on your hips, abdomen, and breasts.  You may develop headaches that can be relieved by medicines. The medicines should be approved by your health care provider.  You may urinate more often because the fetus is pressing on your bladder.  You may develop or continue to have heartburn as a result of your pregnancy.  You may develop constipation because certain hormones are causing the muscles that push waste through your intestines to slow down.  You may develop hemorrhoids or swollen, bulging veins (varicose veins).  You may have back pain. This is caused by:  Weight gain.  Pregnancy hormones that are relaxing the joints in your pelvis.  A shift in weight and the muscles that support your balance.  Your breasts will continue to grow and they will continue to become tender.  Your gums may bleed and may be sensitive to brushing and flossing.  Dark spots or blotches (chloasma, mask of pregnancy) may develop on your face. This will likely fade after the baby is born.  A dark line from your  belly button to the pubic area (linea nigra) may appear. This will likely fade after the baby is born.  You may have changes in your hair. These can include thickening of your hair, rapid growth, and changes in texture. Some women also have hair loss during or after pregnancy, or hair that feels dry or thin. Your hair will most likely return to normal after your baby is born. What to expect at prenatal visits During a routine prenatal visit:  You will be weighed to make sure you and the fetus are growing normally.  Your blood pressure will be taken.  Your abdomen will be measured to track your baby's growth.  The fetal heartbeat will be listened to.  Any test results from the previous visit will be discussed. Your health care provider may ask you:  How you are feeling.  If you are feeling the baby move.  If you have had any abnormal symptoms, such as leaking fluid, bleeding, severe headaches, or abdominal cramping.  If you are using any tobacco products, including cigarettes, chewing tobacco, and electronic cigarettes.  If you have any questions. Other tests that may be performed during your second trimester include:  Blood tests that check for:  Low iron levels (anemia).  High blood sugar that affects pregnant women (gestational diabetes) between 24 and 28 weeks.  Rh antibodies. This is to check for a protein on red blood cells (Rh factor).  Urine tests to check for infections, diabetes, or protein in the   urine.  An ultrasound to confirm the proper growth and development of the baby.  An amniocentesis to check for possible genetic problems.  Fetal screens for spina bifida and Down syndrome.  HIV (human immunodeficiency virus) testing. Routine prenatal testing includes screening for HIV, unless you choose not to have this test. Follow these instructions at home: Medicines   Follow your health care provider's instructions regarding medicine use. Specific medicines may  be either safe or unsafe to take during pregnancy.  Take a prenatal vitamin that contains at least 600 micrograms (mcg) of folic acid.  If you develop constipation, try taking a stool softener if your health care provider approves. Eating and drinking   Eat a balanced diet that includes fresh fruits and vegetables, whole grains, good sources of protein such as meat, eggs, or tofu, and low-fat dairy. Your health care provider will help you determine the amount of weight gain that is right for you.  Avoid raw meat and uncooked cheese. These carry germs that can cause birth defects in the baby.  If you have low calcium intake from food, talk to your health care provider about whether you should take a daily calcium supplement.  Limit foods that are high in fat and processed sugars, such as fried and sweet foods.  To prevent constipation:  Drink enough fluid to keep your urine clear or pale yellow.  Eat foods that are high in fiber, such as fresh fruits and vegetables, whole grains, and beans. Activity   Exercise only as directed by your health care provider. Most women can continue their usual exercise routine during pregnancy. Try to exercise for 30 minutes at least 5 days a week. Stop exercising if you experience uterine contractions.  Avoid heavy lifting, wear low heel shoes, and practice good posture.  A sexual relationship may be continued unless your health care provider directs you otherwise. Relieving pain and discomfort   Wear a good support bra to prevent discomfort from breast tenderness.  Take warm sitz baths to soothe any pain or discomfort caused by hemorrhoids. Use hemorrhoid cream if your health care provider approves.  Rest with your legs elevated if you have leg cramps or low back pain.  If you develop varicose veins, wear support hose. Elevate your feet for 15 minutes, 3-4 times a day. Limit salt in your diet. Prenatal Care   Write down your questions. Take them  to your prenatal visits.  Keep all your prenatal visits as told by your health care provider. This is important. Safety   Wear your seat belt at all times when driving.  Make a list of emergency phone numbers, including numbers for family, friends, the hospital, and police and fire departments. General instructions   Ask your health care provider for a referral to a local prenatal education class. Begin classes no later than the beginning of month 6 of your pregnancy.  Ask for help if you have counseling or nutritional needs during pregnancy. Your health care provider can offer advice or refer you to specialists for help with various needs.  Do not use hot tubs, steam rooms, or saunas.  Do not douche or use tampons or scented sanitary pads.  Do not cross your legs for long periods of time.  Avoid cat litter boxes and soil used by cats. These carry germs that can cause birth defects in the baby and possibly loss of the fetus by miscarriage or stillbirth.  Avoid all smoking, herbs, alcohol, and unprescribed drugs. Chemicals in   these products can affect the formation and growth of the baby.  Do not use any products that contain nicotine or tobacco, such as cigarettes and e-cigarettes. If you need help quitting, ask your health care provider.  Visit your dentist if you have not gone yet during your pregnancy. Use a soft toothbrush to brush your teeth and be gentle when you floss. Contact a health care provider if:  You have dizziness.  You have mild pelvic cramps, pelvic pressure, or nagging pain in the abdominal area.  You have persistent nausea, vomiting, or diarrhea.  You have a bad smelling vaginal discharge.  You have pain when you urinate. Get help right away if:  You have a fever.  You are leaking fluid from your vagina.  You have spotting or bleeding from your vagina.  You have severe abdominal cramping or pain.  You have rapid weight gain or weight loss.  You  have shortness of breath with chest pain.  You notice sudden or extreme swelling of your face, hands, ankles, feet, or legs.  You have not felt your baby move in over an hour.  You have severe headaches that do not go away when you take medicine.  You have vision changes. Summary  The second trimester is from week 14 through week 27 (months 4 through 6). It is also a time when the fetus is growing rapidly.  Your body goes through many changes during pregnancy. The changes vary from woman to woman.  Avoid all smoking, herbs, alcohol, and unprescribed drugs. These chemicals affect the formation and growth your baby.  Do not use any tobacco products, such as cigarettes, chewing tobacco, and e-cigarettes. If you need help quitting, ask your health care provider.  Contact your health care provider if you have any questions. Keep all prenatal visits as told by your health care provider. This is important. This information is not intended to replace advice given to you by your health care provider. Make sure you discuss any questions you have with your health care provider. Document Released: 09/28/2001 Document Revised: 03/11/2016 Document Reviewed: 12/05/2012 Elsevier Interactive Patient Education  2017 [place] Medications in Pregnancy   Acne: Benzoyl Peroxide Salicylic Acid  Backache/Headache: Tylenol: 2 regular strength every 4 hours OR              2 Extra strength every 6 hours  Colds/Coughs/Allergies: Benadryl (alcohol free) 25 mg every 6 hours as needed Breath right strips Claritin Cepacol throat lozenges Chloraseptic throat spray Cold-Eeze- up to three times per day Cough drops, alcohol free Flonase (by prescription only) Guaifenesin Mucinex Robitussin DM (plain only, alcohol free) Saline nasal spray/drops Sudafed (pseudoephedrine) & Actifed ** use only after [redacted] weeks gestation and if you do not have high blood pressure Tylenol Vicks Vaporub Zinc  lozenges Zyrtec   Constipation: Colace Ducolax suppositories Fleet enema Glycerin suppositories Metamucil Milk of magnesia Miralax Senokot Smooth move tea  Diarrhea: Kaopectate Imodium A-D  *NO pepto Bismol  Hemorrhoids: Anusol Anusol HC Preparation H Tucks  Indigestion: Tums Maalox Mylanta Zantac  Pepcid  Insomnia: Benadryl (alcohol free) 25mg  every 6 hours as needed Tylenol PM Unisom, no Gelcaps  Leg Cramps: Tums MagGel  Nausea/Vomiting:  Bonine Dramamine Emetrol Ginger extract Sea bands Meclizine  Nausea medication to take during pregnancy:  Unisom (doxylamine succinate 25 mg tablets) Take one tablet daily at bedtime. If symptoms are not adequately controlled, the dose can be increased to a maximum recommended dose of two tablets  daily (1/2 tablet in the morning, 1/2 tablet mid-afternoon and one at bedtime). Vitamin B6 100mg  tablets. Take one tablet twice a day (up to 200 mg per day).  Skin Rashes: Aveeno products Benadryl cream or 25mg  every 6 hours as needed Calamine Lotion 1% cortisone cream  Yeast infection: Gyne-lotrimin 7 Monistat 7   **If taking multiple medications, please check labels to avoid duplicating the same active ingredients **take medication as directed on the label ** Do not exceed 4000 mg of tylenol in 24 hours **Do not take medications that contain aspirin or ibuprofen

## 2016-12-29 NOTE — Progress Notes (Signed)
G2P1001 [redacted]w[redacted]d Estimated Date of Delivery: 05/25/17  Blood pressure 128/64, pulse 80, weight 191 lb (86.6 kg), last menstrual period 08/12/2016.   BP weight and urine results all reviewed and noted.  Please refer to the obstetrical flow sheet for the fundal height and fetal heart rate documentation:  [place] 19 wks,breech,ant pl gr 0,cx 3.6 cm,svp of fluid 5.6 cm,right corpus luteal cyst 3.1 x 2 x 2.5 cm,normal left ovary,fhr 138 bpm,efw 283 g,anatomy complete,no obvious abnormalities   Patient reports good fetal movement, denies any bleeding and no rupture of membranes symptoms or regular contractions. Patient has had URI sx (scratchy throat for 2-3 days, sinus pressure For 8 days.  Rx amoxicilin sent to pharmacy--if not getting gmetter in a day or 2, start taking.  All questions were answered.  Orders Placed This Encounter  Procedures  . TSH  . POCT Urinalysis Dipstick    Plan:  Continued routine obstetrical care,   Return in about 4 weeks (around 01/26/2017) for LROB.

## 2016-12-30 LAB — TSH: TSH: 3.74 u[IU]/mL (ref 0.450–4.500)

## 2017-01-26 ENCOUNTER — Ambulatory Visit (INDEPENDENT_AMBULATORY_CARE_PROVIDER_SITE_OTHER): Payer: BLUE CROSS/BLUE SHIELD | Admitting: Advanced Practice Midwife

## 2017-01-26 ENCOUNTER — Encounter: Payer: Self-pay | Admitting: Advanced Practice Midwife

## 2017-01-26 VITALS — BP 122/70 | HR 76 | Wt 191.0 lb

## 2017-01-26 DIAGNOSIS — Z3482 Encounter for supervision of other normal pregnancy, second trimester: Secondary | ICD-10-CM

## 2017-01-26 DIAGNOSIS — Z331 Pregnant state, incidental: Secondary | ICD-10-CM

## 2017-01-26 DIAGNOSIS — Z1389 Encounter for screening for other disorder: Secondary | ICD-10-CM

## 2017-01-26 LAB — POCT URINALYSIS DIPSTICK
Blood, UA: NEGATIVE
Glucose, UA: NEGATIVE
Ketones, UA: NEGATIVE
Leukocytes, UA: NEGATIVE — AB
Nitrite, UA: NEGATIVE
Protein, UA: NEGATIVE

## 2017-01-26 NOTE — Progress Notes (Signed)
G2P1001 [redacted]w[redacted]d Estimated Date of Delivery: 05/25/17  Blood pressure 122/70, pulse 76, weight 191 lb (86.6 kg), last menstrual period 08/12/2016.   BP weight and urine results all reviewed and noted.  Please refer to the obstetrical flow sheet for the fundal height and fetal heart rate documentation:  Patient reports good fetal movement, denies any bleeding and no rupture of membranes symptoms or regular contractions. Patient is without complaints. All questions were answered.  Orders Placed This Encounter  Procedures  . POCT urinalysis dipstick    Plan:  Continued routine obstetrical care,   Return in about 4 weeks (around 02/23/2017) for PN2/LROB.

## 2017-01-26 NOTE — Patient Instructions (Signed)

## 2017-02-23 ENCOUNTER — Other Ambulatory Visit: Payer: BLUE CROSS/BLUE SHIELD

## 2017-02-23 ENCOUNTER — Encounter: Payer: Self-pay | Admitting: Advanced Practice Midwife

## 2017-02-23 ENCOUNTER — Ambulatory Visit (INDEPENDENT_AMBULATORY_CARE_PROVIDER_SITE_OTHER): Payer: BLUE CROSS/BLUE SHIELD | Admitting: Advanced Practice Midwife

## 2017-02-23 DIAGNOSIS — Z331 Pregnant state, incidental: Secondary | ICD-10-CM

## 2017-02-23 DIAGNOSIS — Z1389 Encounter for screening for other disorder: Secondary | ICD-10-CM

## 2017-02-23 DIAGNOSIS — Z3482 Encounter for supervision of other normal pregnancy, second trimester: Secondary | ICD-10-CM

## 2017-02-23 DIAGNOSIS — Z131 Encounter for screening for diabetes mellitus: Secondary | ICD-10-CM

## 2017-02-23 DIAGNOSIS — Z3A27 27 weeks gestation of pregnancy: Secondary | ICD-10-CM | POA: Diagnosis not present

## 2017-02-23 LAB — POCT URINALYSIS DIPSTICK
Blood, UA: NEGATIVE
Glucose, UA: NEGATIVE
Leukocytes, UA: NEGATIVE
Nitrite, UA: NEGATIVE

## 2017-02-23 NOTE — Progress Notes (Signed)
G2P1001 [redacted]w[redacted]d Estimated Date of Delivery: 05/25/17  Blood pressure 110/60, pulse 78, weight 192 lb (87.1 kg), last menstrual period 08/12/2016.   BP weight and urine results all reviewed and noted.  Please refer to the obstetrical flow sheet for the fundal height and fetal heart rate documentation:  Patient reports good fetal movement, denies any bleeding and no rupture of membranes symptoms or regular contractions. Patient is without complaints. All questions were answered.  Orders Placed This Encounter  Procedures  . POCT urinalysis dipstick    Plan:  Continued routine obstetrical care, will get TDAP. No weight gain, no N/V, eating balanced meals, appetite WNL.   Return in about 3 weeks (around 03/16/2017) for LROB.

## 2017-02-23 NOTE — Patient Instructions (Addendum)

## 2017-02-24 LAB — CBC
Hematocrit: 36.3 % (ref 34.0–46.6)
Hemoglobin: 11.7 g/dL (ref 11.1–15.9)
MCH: 29.5 pg (ref 26.6–33.0)
MCHC: 32.2 g/dL (ref 31.5–35.7)
MCV: 92 fL (ref 79–97)
Platelets: 316 10*3/uL (ref 150–379)
RBC: 3.96 x10E6/uL (ref 3.77–5.28)
RDW: 14.2 % (ref 12.3–15.4)
WBC: 11.8 10*3/uL — ABNORMAL HIGH (ref 3.4–10.8)

## 2017-02-24 LAB — RPR: RPR Ser Ql: NONREACTIVE

## 2017-02-24 LAB — GLUCOSE TOLERANCE, 2 HOURS W/ 1HR
Glucose, 1 hour: 104 mg/dL (ref 65–179)
Glucose, 2 hour: 65 mg/dL (ref 65–152)
Glucose, Fasting: 70 mg/dL (ref 65–91)

## 2017-02-24 LAB — ANTIBODY SCREEN: Antibody Screen: NEGATIVE

## 2017-02-24 LAB — HIV ANTIBODY (ROUTINE TESTING W REFLEX): HIV Screen 4th Generation wRfx: NONREACTIVE

## 2017-03-04 DIAGNOSIS — Z23 Encounter for immunization: Secondary | ICD-10-CM | POA: Diagnosis not present

## 2017-03-16 ENCOUNTER — Encounter: Payer: BLUE CROSS/BLUE SHIELD | Admitting: Women's Health

## 2017-03-16 ENCOUNTER — Ambulatory Visit (INDEPENDENT_AMBULATORY_CARE_PROVIDER_SITE_OTHER): Payer: BLUE CROSS/BLUE SHIELD | Admitting: Women's Health

## 2017-03-16 ENCOUNTER — Encounter: Payer: Self-pay | Admitting: Women's Health

## 2017-03-16 VITALS — BP 128/68 | HR 88 | Wt 195.0 lb

## 2017-03-16 DIAGNOSIS — E039 Hypothyroidism, unspecified: Secondary | ICD-10-CM

## 2017-03-16 DIAGNOSIS — Z331 Pregnant state, incidental: Secondary | ICD-10-CM

## 2017-03-16 DIAGNOSIS — Z3483 Encounter for supervision of other normal pregnancy, third trimester: Secondary | ICD-10-CM

## 2017-03-16 DIAGNOSIS — Z98891 History of uterine scar from previous surgery: Secondary | ICD-10-CM

## 2017-03-16 DIAGNOSIS — Z1389 Encounter for screening for other disorder: Secondary | ICD-10-CM

## 2017-03-16 LAB — POCT URINALYSIS DIPSTICK
Blood, UA: NEGATIVE
Glucose, UA: NEGATIVE
Leukocytes, UA: NEGATIVE
Nitrite, UA: NEGATIVE
Protein, UA: NEGATIVE

## 2017-03-16 NOTE — Progress Notes (Addendum)
Low-risk OB appointment G2P1001 [redacted]w[redacted]d Estimated Date of Delivery: 05/25/17 BP 128/68   Pulse 88   Wt 195 lb (88.5 kg)   LMP 08/12/2016 (Exact Date)   BMI 32.45 kg/m   BP, weight, and urine reviewed.  Refer to obstetrical flow sheet for FH & FHR.  Reports good fm.  Denies regular uc's, lof, vb, or uti s/s. Occ dizzy spells, happened during sugar test too. Discussed eating more protein to keep blood sugar at more steady level.  Undecided about VBAC vs. RCS, lost consent, gave today to take home and review. If has c/s wants BTL (no mcaid, so doesn't need to sign consent now). Hypothyroidism, needs 3rd trimester TSH, wants to do at next visit Got tdap 03/04/17 Reviewed ptl s/s, fkc, normal pn2 results. Plan:  Continue routine obstetrical care  F/U in 2wks for OB appointment and TSH

## 2017-03-16 NOTE — Patient Instructions (Signed)
Call the office [phone]) or go to [facility] if:  You begin to have strong, frequent contractions  Your water breaks.  Sometimes it is a big gush of fluid, sometimes it is just a trickle that keeps getting your panties wet or running down your legs  You have vaginal bleeding.  It is normal to have a small amount of spotting if your cervix was checked.   You don't feel your baby moving like normal.  If you don't, get you something to eat and drink and lay down and focus on feeling your baby move.  You should feel at least 10 movements in 2 hours.  If you don't, you should call the office or go to [place]:   This is usually related to either your blood sugar or your blood pressure dropping  Make sure you are staying well hydrated and drinking enough water so that your urine is clear  Eat small frequent meals and snacks containing protein (meat, eggs, nuts, cheese) so that your blood sugar doesn't drop  If you do get dizzy, sit/lay down and get you something to drink and a snack containing protein- you will usually start feeling better in 10-20 minutes   Third Trimester of Pregnancy The third trimester is from week 28 through week 40 (months 7 through 9). The third trimester is a time when the unborn baby (fetus) is growing rapidly. At the end of the ninth month, the fetus is about 20 inches in length and weighs 6-10 pounds. Body changes during your third trimester Your body will continue to go through many changes during pregnancy. The changes vary from woman to woman. During the third trimester:  Your weight will continue to increase. You can expect to gain 25-35 pounds (11-16 kg) by the end of the pregnancy.  You may begin to get stretch marks on your hips, abdomen, and breasts.  You may urinate more often because the fetus is moving lower into your pelvis and pressing on your bladder.  You may develop or continue to have heartburn. This is caused  by increased hormones that slow down muscles in the digestive tract.  You may develop or continue to have constipation because increased hormones slow digestion and cause the muscles that push waste through your intestines to relax.  You may develop hemorrhoids. These are swollen veins (varicose veins) in the rectum that can itch or be painful.  You may develop swollen, bulging veins (varicose veins) in your legs.  You may have increased body aches in the pelvis, back, or thighs. This is due to weight gain and increased hormones that are relaxing your joints.  You may have changes in your hair. These can include thickening of your hair, rapid growth, and changes in texture. Some women also have hair loss during or after pregnancy, or hair that feels dry or thin. Your hair will most likely return to normal after your baby is born.  Your breasts will continue to grow and they will continue to become tender. A yellow fluid (colostrum) may leak from your breasts. This is the first milk you are producing for your baby.  Your belly button may stick out.  You may notice more swelling in your hands, face, or ankles.  You may have increased tingling or numbness in your hands, arms, and legs. The skin on your belly may also feel numb.  You may feel short of breath because of your expanding uterus.  You  may have more problems sleeping. This can be caused by the size of your belly, increased need to urinate, and an increase in your body's metabolism.  You may notice the fetus "dropping," or moving lower in your abdomen (lightening).  You may have increased vaginal discharge.  You may notice your joints feel loose and you may have pain around your pelvic bone. What to expect at prenatal visits You will have prenatal exams every 2 weeks until week 36. Then you will have weekly prenatal exams. During a routine prenatal visit:  You will be weighed to make sure you and the baby are growing  normally.  Your blood pressure will be taken.  Your abdomen will be measured to track your baby's growth.  The fetal heartbeat will be listened to.  Any test results from the previous visit will be discussed.  You may have a cervical check near your due date to see if your cervix has softened or thinned (effaced).  You will be tested for Group B streptococcus. This happens between 35 and 37 weeks. Your health care provider may ask you:  What your birth plan is.  How you are feeling.  If you are feeling the baby move.  If you have had any abnormal symptoms, such as leaking fluid, bleeding, severe headaches, or abdominal cramping.  If you are using any tobacco products, including cigarettes, chewing tobacco, and electronic cigarettes.  If you have any questions. Other tests or screenings that may be performed during your third trimester include:  Blood tests that check for low iron levels (anemia).  Fetal testing to check the health, activity level, and growth of the fetus. Testing is done if you have certain medical conditions or if there are problems during the pregnancy.  Nonstress test (NST). This test checks the health of your baby to make sure there are no signs of problems, such as the baby not getting enough oxygen. During this test, a belt is placed around your belly. The baby is made to move, and its heart rate is monitored during movement. What is false labor? False labor is a condition in which you feel small, irregular tightenings of the muscles in the womb (contractions) that usually go away with rest, changing position, or drinking water. These are called Braxton Hicks contractions. Contractions may last for hours, days, or even weeks before true labor sets in. If contractions come at regular intervals, become more frequent, increase in intensity, or become painful, you should see your health care provider. What are the signs of labor?  Abdominal cramps.  Regular  contractions that start at 10 minutes apart and become stronger and more frequent with time.  Contractions that start on the top of the uterus and spread down to the lower abdomen and back.  Increased pelvic pressure and dull back pain.  A watery or bloody mucus discharge that comes from the vagina.  Leaking of amniotic fluid. This is also known as your "water breaking." It could be a slow trickle or a gush. Let your health care provider know if it has a color or strange odor. If you have any of these signs, call your health care provider right away, even if it is before your due date. Follow these instructions at home: Medicines   Follow your health care provider's instructions regarding medicine use. Specific medicines may be either safe or unsafe to take during pregnancy.  Take a prenatal vitamin that contains at least 600 micrograms (mcg) of folic acid.  If you develop constipation, try taking a stool softener if your health care provider approves. Eating and drinking   Eat a balanced diet that includes fresh fruits and vegetables, whole grains, good sources of protein such as meat, eggs, or tofu, and low-fat dairy. Your health care provider will help you determine the amount of weight gain that is right for you.  Avoid raw meat and uncooked cheese. These carry germs that can cause birth defects in the baby.  If you have low calcium intake from food, talk to your health care provider about whether you should take a daily calcium supplement.  Eat four or five small meals rather than three large meals a day.  Limit foods that are high in fat and processed sugars, such as fried and sweet foods.  To prevent constipation:  Drink enough fluid to keep your urine clear or pale yellow.  Eat foods that are high in fiber, such as fresh fruits and vegetables, whole grains, and beans. Activity   Exercise only as directed by your health care provider. Most women can continue their usual  exercise routine during pregnancy. Try to exercise for 30 minutes at least 5 days a week. Stop exercising if you experience uterine contractions.  Avoid heavy lifting.  Do not exercise in extreme heat or humidity, or at high altitudes.  Wear low-heel, comfortable shoes.  Practice good posture.  You may continue to have sex unless your health care provider tells you otherwise. Relieving pain and discomfort   Take frequent breaks and rest with your legs elevated if you have leg cramps or low back pain.  Take warm sitz baths to soothe any pain or discomfort caused by hemorrhoids. Use hemorrhoid cream if your health care provider approves.  Wear a good support bra to prevent discomfort from breast tenderness.  If you develop varicose veins:  Wear support pantyhose or compression stockings as told by your healthcare provider.  Elevate your feet for 15 minutes, 3-4 times a day. Prenatal care   Write down your questions. Take them to your prenatal visits.  Keep all your prenatal visits as told by your health care provider. This is important. Safety   Wear your seat belt at all times when driving.  Make a list of emergency phone numbers, including numbers for family, friends, the hospital, and police and fire departments. General instructions   Avoid cat litter boxes and soil used by cats. These carry germs that can cause birth defects in the baby. If you have a cat, ask someone to clean the litter box for you.  Do not travel far distances unless it is absolutely necessary and only with the approval of your health care provider.  Do not use hot tubs, steam rooms, or saunas.  Do not drink alcohol.  Do not use any products that contain nicotine or tobacco, such as cigarettes and e-cigarettes. If you need help quitting, ask your health care provider.  Do not use any medicinal herbs or unprescribed drugs. These chemicals affect the formation and growth of the baby.  Do not douche  or use tampons or scented sanitary pads.  Do not cross your legs for long periods of time.  To prepare for the arrival of your baby:  Take prenatal classes to understand, practice, and ask questions about labor and delivery.  Make a trial run to the hospital.  Visit the hospital and tour the maternity area.  Arrange for maternity or paternity leave through employers.  Arrange for family  and friends to take care of pets while you are in the hospital.  Purchase a rear-facing car seat and make sure you know how to install it in your car.  Pack your hospital bag.  Prepare the baby's nursery. Make sure to remove all pillows and stuffed animals from the baby's crib to prevent suffocation.  Visit your dentist if you have not gone during your pregnancy. Use a soft toothbrush to brush your teeth and be gentle when you floss. Contact a health care provider if:  You are unsure if you are in labor or if your water has broken.  You become dizzy.  You have mild pelvic cramps, pelvic pressure, or nagging pain in your abdominal area.  You have lower back pain.  You have persistent nausea, vomiting, or diarrhea.  You have an unusual or bad smelling vaginal discharge.  You have pain when you urinate. Get help right away if:  Your water breaks before 37 weeks.  You have regular contractions less than 5 minutes apart before 37 weeks.  You have a fever.  You are leaking fluid from your vagina.  You have spotting or bleeding from your vagina.  You have severe abdominal pain or cramping.  You have rapid weight loss or weight gain.  You have shortness of breath with chest pain.  You notice sudden or extreme swelling of your face, hands, ankles, feet, or legs.  Your baby makes fewer than 10 movements in 2 hours.  You have severe headaches that do not go away when you take medicine.  You have vision changes. Summary  The third trimester is from week 28 through week 40, months 7  through 9. The third trimester is a time when the unborn baby (fetus) is growing rapidly.  During the third trimester, your discomfort may increase as you and your baby continue to gain weight. You may have abdominal, leg, and back pain, sleeping problems, and an increased need to urinate.  During the third trimester your breasts will keep growing and they will continue to become tender. A yellow fluid (colostrum) may leak from your breasts. This is the first milk you are producing for your baby.  False labor is a condition in which you feel small, irregular tightenings of the muscles in the womb (contractions) that eventually go away. These are called Braxton Hicks contractions. Contractions may last for hours, days, or even weeks before true labor sets in.  Signs of labor can include: abdominal cramps; regular contractions that start at 10 minutes apart and become stronger and more frequent with time; watery or bloody mucus discharge that comes from the vagina; increased pelvic pressure and dull back pain; and leaking of amniotic fluid. This information is not intended to replace advice given to you by your health care provider. Make sure you discuss any questions you have with your health care provider. Document Released: 09/28/2001 Document Revised: 03/11/2016 Document Reviewed: 12/05/2012 Elsevier Interactive Patient Education  2017 [employer].

## 2017-03-30 ENCOUNTER — Ambulatory Visit (INDEPENDENT_AMBULATORY_CARE_PROVIDER_SITE_OTHER): Payer: BLUE CROSS/BLUE SHIELD | Admitting: Advanced Practice Midwife

## 2017-03-30 ENCOUNTER — Encounter: Payer: Self-pay | Admitting: Advanced Practice Midwife

## 2017-03-30 VITALS — BP 120/82 | HR 70 | Wt 195.0 lb

## 2017-03-30 DIAGNOSIS — Z3483 Encounter for supervision of other normal pregnancy, third trimester: Secondary | ICD-10-CM

## 2017-03-30 DIAGNOSIS — Z1389 Encounter for screening for other disorder: Secondary | ICD-10-CM

## 2017-03-30 DIAGNOSIS — E039 Hypothyroidism, unspecified: Secondary | ICD-10-CM

## 2017-03-30 DIAGNOSIS — Z331 Pregnant state, incidental: Secondary | ICD-10-CM

## 2017-03-30 LAB — POCT URINALYSIS DIPSTICK
Blood, UA: NEGATIVE
Glucose, UA: NEGATIVE
Ketones, UA: NEGATIVE
Leukocytes, UA: NEGATIVE
Nitrite, UA: NEGATIVE

## 2017-03-30 NOTE — Progress Notes (Signed)
G2P1001 [redacted]w[redacted]d Estimated Date of Delivery: 05/25/17  Blood pressure 120/82, pulse 70, weight 195 lb (88.5 kg), last menstrual period 08/12/2016.   BP weight and urine results all reviewed and noted.  Please refer to the obstetrical flow sheet for the fundal height and fetal heart rate documentation:  Patient reports good fetal movement, denies any bleeding and no rupture of membranes symptoms or regular contractions. Patient is without complaints. Eats "all the time".   All questions were answered.  Orders Placed This Encounter  Procedures  . TSH  . POCT urinalysis dipstick    Plan:  Continued routine obstetrical care,   Return in about 2 weeks (around 04/13/2017) for LROB (see [facility]--schedule CS).

## 2017-03-31 LAB — TSH: TSH: 2.39 u[IU]/mL (ref 0.450–4.500)

## 2017-04-14 ENCOUNTER — Ambulatory Visit (INDEPENDENT_AMBULATORY_CARE_PROVIDER_SITE_OTHER): Payer: BLUE CROSS/BLUE SHIELD | Admitting: Obstetrics & Gynecology

## 2017-04-14 ENCOUNTER — Encounter: Payer: Self-pay | Admitting: Obstetrics & Gynecology

## 2017-04-14 VITALS — BP 110/70 | HR 84 | Wt 195.0 lb

## 2017-04-14 DIAGNOSIS — Z1389 Encounter for screening for other disorder: Secondary | ICD-10-CM | POA: Diagnosis not present

## 2017-04-14 DIAGNOSIS — Z3483 Encounter for supervision of other normal pregnancy, third trimester: Secondary | ICD-10-CM

## 2017-04-14 DIAGNOSIS — Z331 Pregnant state, incidental: Secondary | ICD-10-CM | POA: Diagnosis not present

## 2017-04-14 DIAGNOSIS — O34219 Maternal care for unspecified type scar from previous cesarean delivery: Secondary | ICD-10-CM

## 2017-04-14 DIAGNOSIS — Z3A34 34 weeks gestation of pregnancy: Secondary | ICD-10-CM

## 2017-04-14 LAB — POCT URINALYSIS DIPSTICK
Blood, UA: NEGATIVE
Glucose, UA: NEGATIVE
Ketones, UA: NEGATIVE
Leukocytes, UA: NEGATIVE
Nitrite, UA: NEGATIVE

## 2017-04-14 NOTE — Progress Notes (Signed)
G2P1001 [redacted]w[redacted]d Estimated Date of Delivery: 05/25/17  Blood pressure 110/70, pulse 84, weight 195 lb (88.5 kg), last menstrual period 08/12/2016.   BP weight and urine results all reviewed and noted.  Please refer to the obstetrical flow sheet for the fundal height and fetal heart rate documentation:  Patient reports good fetal movement, denies any bleeding and no rupture of membranes symptoms or regular contractions. Patient is without complaints. All questions were answered.  Orders Placed This Encounter  Procedures  . POCT urinalysis dipstick    Plan:  Continued routine obstetrical care, repeat c section + BTL 05/18/2017  Return in about 2 weeks (around 04/28/2017) for [place], with Dr Elonda Husky.

## 2017-04-28 ENCOUNTER — Encounter: Payer: Self-pay | Admitting: Obstetrics & Gynecology

## 2017-04-28 ENCOUNTER — Ambulatory Visit (INDEPENDENT_AMBULATORY_CARE_PROVIDER_SITE_OTHER): Payer: BLUE CROSS/BLUE SHIELD | Admitting: Obstetrics & Gynecology

## 2017-04-28 VITALS — BP 98/58 | HR 86 | Wt 198.0 lb

## 2017-04-28 DIAGNOSIS — Z3A36 36 weeks gestation of pregnancy: Secondary | ICD-10-CM

## 2017-04-28 DIAGNOSIS — O09893 Supervision of other high risk pregnancies, third trimester: Secondary | ICD-10-CM

## 2017-04-28 DIAGNOSIS — O34219 Maternal care for unspecified type scar from previous cesarean delivery: Secondary | ICD-10-CM

## 2017-04-28 DIAGNOSIS — Z331 Pregnant state, incidental: Secondary | ICD-10-CM

## 2017-04-28 DIAGNOSIS — Z3483 Encounter for supervision of other normal pregnancy, third trimester: Secondary | ICD-10-CM

## 2017-04-28 DIAGNOSIS — Z1389 Encounter for screening for other disorder: Secondary | ICD-10-CM

## 2017-04-28 LAB — POCT URINALYSIS DIPSTICK
Blood, UA: NEGATIVE
Glucose, UA: NEGATIVE
Ketones, UA: NEGATIVE
Leukocytes, UA: NEGATIVE
Nitrite, UA: NEGATIVE
Protein, UA: NEGATIVE

## 2017-04-28 NOTE — Progress Notes (Signed)
G2P1001 [redacted]w[redacted]d Estimated Date of Delivery: 05/25/17  Blood pressure (!) 98/58, pulse 86, weight 198 lb (89.8 kg), last menstrual period 08/12/2016.   BP weight and urine results all reviewed and noted.  Please refer to the obstetrical flow sheet for the fundal height and fetal heart rate documentation:  Patient reports good fetal movement, denies any bleeding and no rupture of membranes symptoms or regular contractions. Patient is without complaints. All questions were answered.  Orders Placed This Encounter  Procedures  . POCT urinalysis dipstick    Plan:  Continued routine obstetrical care, had one spot when she wiped today otherwise no mucous or other problems  Repeat C-section and bilateral tubal ligation is planned for 05/18/2017 at 7:30  Return in about 1 week (around 05/05/2017) for [place], with Dr Elonda Husky.

## 2017-05-05 ENCOUNTER — Encounter: Payer: Self-pay | Admitting: Obstetrics & Gynecology

## 2017-05-05 ENCOUNTER — Ambulatory Visit (INDEPENDENT_AMBULATORY_CARE_PROVIDER_SITE_OTHER): Payer: BLUE CROSS/BLUE SHIELD | Admitting: Obstetrics & Gynecology

## 2017-05-05 VITALS — BP 130/92 | HR 95 | Wt 196.0 lb

## 2017-05-05 DIAGNOSIS — Z1389 Encounter for screening for other disorder: Secondary | ICD-10-CM

## 2017-05-05 DIAGNOSIS — O09893 Supervision of other high risk pregnancies, third trimester: Secondary | ICD-10-CM

## 2017-05-05 DIAGNOSIS — Z3A37 37 weeks gestation of pregnancy: Secondary | ICD-10-CM | POA: Diagnosis not present

## 2017-05-05 DIAGNOSIS — Z331 Pregnant state, incidental: Secondary | ICD-10-CM

## 2017-05-05 DIAGNOSIS — Z3483 Encounter for supervision of other normal pregnancy, third trimester: Secondary | ICD-10-CM

## 2017-05-05 DIAGNOSIS — O163 Unspecified maternal hypertension, third trimester: Secondary | ICD-10-CM

## 2017-05-05 DIAGNOSIS — O34219 Maternal care for unspecified type scar from previous cesarean delivery: Secondary | ICD-10-CM

## 2017-05-05 LAB — POCT URINALYSIS DIPSTICK
Blood, UA: NEGATIVE
Glucose, UA: NEGATIVE
Nitrite, UA: NEGATIVE

## 2017-05-05 NOTE — Progress Notes (Signed)
G2P1001 [redacted]w[redacted]d Estimated Date of Delivery: 05/25/17  Blood pressure (!) 130/92, pulse 95, weight 196 lb (88.9 kg), last menstrual period 08/12/2016.   BP weight and urine results all reviewed and noted.  Please refer to the obstetrical flow sheet for the fundal height and fetal heart rate documentation:  Patient reports good fetal movement, denies any bleeding and no rupture of membranes symptoms or regular contractions. Patient is without complaints. All questions were answered.  Orders Placed This Encounter  Procedures  . Strep Gp B NAA  . GC/Chlamydia Probe Amp  . CBC  . Comprehensive Metabolic Panel (CMET)  . Protein / Creatinine Ratio, Urine  . POCT Urinalysis Dipstick    Plan:  Continued routine obstetrical care, BP elevated today, check protein/creatinin and labs Come back in tomorrow for recheck BP as well  Cultures done today    Return in about 1 day (around 05/06/2017) for see Dr Elonda Husky tomorrow for BP check.

## 2017-05-06 ENCOUNTER — Ambulatory Visit (INDEPENDENT_AMBULATORY_CARE_PROVIDER_SITE_OTHER): Payer: BLUE CROSS/BLUE SHIELD | Admitting: Obstetrics & Gynecology

## 2017-05-06 ENCOUNTER — Encounter: Payer: Self-pay | Admitting: Obstetrics & Gynecology

## 2017-05-06 VITALS — BP 120/80 | HR 91 | Wt 196.0 lb

## 2017-05-06 DIAGNOSIS — Z331 Pregnant state, incidental: Secondary | ICD-10-CM

## 2017-05-06 DIAGNOSIS — Z3483 Encounter for supervision of other normal pregnancy, third trimester: Secondary | ICD-10-CM

## 2017-05-06 DIAGNOSIS — Z3A37 37 weeks gestation of pregnancy: Secondary | ICD-10-CM

## 2017-05-06 DIAGNOSIS — Z1389 Encounter for screening for other disorder: Secondary | ICD-10-CM

## 2017-05-06 LAB — POCT URINALYSIS DIPSTICK
Blood, UA: NEGATIVE
Glucose, UA: NEGATIVE
Leukocytes, UA: NEGATIVE
Nitrite, UA: NEGATIVE

## 2017-05-06 NOTE — Progress Notes (Signed)
G2P1001 [redacted]w[redacted]d Estimated Date of Delivery: 05/25/17  Blood pressure 120/80, pulse 91, weight 196 lb (88.9 kg), last menstrual period 08/12/2016.   BP weight and urine results all reviewed and noted.  Please refer to the obstetrical flow sheet for the fundal height and fetal heart rate documentation:  Patient reports good fetal movement, denies any bleeding and no rupture of membranes symptoms or regular contractions. Patient is without complaints. All questions were answered.  Orders Placed This Encounter  Procedures  . POCT Urinalysis Dipstick    Plan:  Continued routine obstetrical care, BP is ok today and all labs are normal Erica Farley is checking her BP at home as well and will record 4 times a day  Return in about 4 days (around 05/10/2017) for LROB, with Dr Elonda Husky.

## 2017-05-07 LAB — STREP GP B NAA: Strep Gp B NAA: NEGATIVE

## 2017-05-08 LAB — CBC
Hematocrit: 35.6 % (ref 34.0–46.6)
Hemoglobin: 11 g/dL — ABNORMAL LOW (ref 11.1–15.9)
MCH: 26.9 pg (ref 26.6–33.0)
MCHC: 30.9 g/dL — ABNORMAL LOW (ref 31.5–35.7)
MCV: 87 fL (ref 79–97)
Platelets: 336 10*3/uL (ref 150–379)
RBC: 4.09 x10E6/uL (ref 3.77–5.28)
RDW: 16.1 % — ABNORMAL HIGH (ref 12.3–15.4)
WBC: 12 10*3/uL — ABNORMAL HIGH (ref 3.4–10.8)

## 2017-05-08 LAB — COMPREHENSIVE METABOLIC PANEL
ALT: 7 IU/L (ref 0–32)
AST: 13 IU/L (ref 0–40)
Albumin/Globulin Ratio: 1.5 (ref 1.2–2.2)
Albumin: 3.8 g/dL (ref 3.5–5.5)
Alkaline Phosphatase: 119 IU/L — ABNORMAL HIGH (ref 39–117)
BUN/Creatinine Ratio: 11 (ref 9–23)
BUN: 8 mg/dL (ref 6–20)
Bilirubin Total: 0.5 mg/dL (ref 0.0–1.2)
CO2: 20 mmol/L (ref 20–29)
Calcium: 9.4 mg/dL (ref 8.7–10.2)
Chloride: 101 mmol/L (ref 96–106)
Creatinine, Ser: 0.74 mg/dL (ref 0.57–1.00)
GFR calc Af Amer: 125 mL/min/{1.73_m2} (ref >59)
GFR calc non Af Amer: 108 mL/min/{1.73_m2} (ref >59)
Globulin, Total: 2.6 g/dL (ref 1.5–4.5)
Glucose: 83 mg/dL (ref 65–99)
Potassium: 4.7 mmol/L (ref 3.5–5.2)
Sodium: 136 mmol/L (ref 134–144)
Total Protein: 6.4 g/dL (ref 6.0–8.5)

## 2017-05-08 LAB — PROTEIN / CREATININE RATIO, URINE
Creatinine, Urine: 206 mg/dL
Protein, Ur: 21.6 mg/dL
Protein/Creat Ratio: 105 mg/g creat (ref 0–200)

## 2017-05-09 ENCOUNTER — Encounter (HOSPITAL_COMMUNITY): Payer: Self-pay

## 2017-05-09 LAB — GC/CHLAMYDIA PROBE AMP
Chlamydia trachomatis, NAA: NEGATIVE
Neisseria gonorrhoeae by PCR: NEGATIVE

## 2017-05-10 ENCOUNTER — Ambulatory Visit (INDEPENDENT_AMBULATORY_CARE_PROVIDER_SITE_OTHER): Payer: BLUE CROSS/BLUE SHIELD | Admitting: Obstetrics & Gynecology

## 2017-05-10 ENCOUNTER — Encounter: Payer: Self-pay | Admitting: Obstetrics & Gynecology

## 2017-05-10 VITALS — BP 110/64 | HR 86 | Wt 198.0 lb

## 2017-05-10 DIAGNOSIS — Z331 Pregnant state, incidental: Secondary | ICD-10-CM

## 2017-05-10 DIAGNOSIS — Z3A37 37 weeks gestation of pregnancy: Secondary | ICD-10-CM

## 2017-05-10 DIAGNOSIS — Z1389 Encounter for screening for other disorder: Secondary | ICD-10-CM

## 2017-05-10 DIAGNOSIS — Z3483 Encounter for supervision of other normal pregnancy, third trimester: Secondary | ICD-10-CM

## 2017-05-10 LAB — POCT URINALYSIS DIPSTICK
Blood, UA: NEGATIVE
Glucose, UA: NEGATIVE
Ketones, UA: NEGATIVE
Leukocytes, UA: NEGATIVE
Nitrite, UA: NEGATIVE
Protein, UA: NEGATIVE

## 2017-05-10 NOTE — Progress Notes (Signed)
G2P1001 [redacted]w[redacted]d Estimated Date of Delivery: 05/25/17  Blood pressure 110/64, pulse 86, weight 198 lb (89.8 kg), last menstrual period 08/12/2016.   BP weight and urine results all reviewed and noted.  Please refer to the obstetrical flow sheet for the fundal height and fetal heart rate documentation:  Patient reports good fetal movement, denies any bleeding and no rupture of membranes symptoms or regular contractions. Patient is without complaints. All questions were answered.  Orders Placed This Encounter  Procedures  . POCT urinalysis dipstick    Plan:  Continued routine obstetrical care, scheduled for repeat section + BTL next week 05/18/2017  Return in about 2 weeks (around 05/26/2017) for [place], with Dr Elonda Husky.

## 2017-05-16 ENCOUNTER — Other Ambulatory Visit: Payer: Self-pay | Admitting: Obstetrics & Gynecology

## 2017-05-17 ENCOUNTER — Encounter (HOSPITAL_COMMUNITY)
Admission: RE | Admit: 2017-05-17 | Discharge: 2017-05-17 | Disposition: A | Discharge status: Home / Self Care | Payer: BLUE CROSS/BLUE SHIELD | Source: Ambulatory Visit | Attending: Obstetrics & Gynecology | Admitting: Obstetrics & Gynecology

## 2017-05-17 DIAGNOSIS — Z3A00 Weeks of gestation of pregnancy not specified: Secondary | ICD-10-CM | POA: Diagnosis not present

## 2017-05-17 DIAGNOSIS — E039 Hypothyroidism, unspecified: Secondary | ICD-10-CM | POA: Diagnosis not present

## 2017-05-17 DIAGNOSIS — O34219 Maternal care for unspecified type scar from previous cesarean delivery: Secondary | ICD-10-CM | POA: Diagnosis not present

## 2017-05-17 DIAGNOSIS — Z302 Encounter for sterilization: Secondary | ICD-10-CM | POA: Diagnosis not present

## 2017-05-17 DIAGNOSIS — O99284 Endocrine, nutritional and metabolic diseases complicating childbirth: Secondary | ICD-10-CM | POA: Diagnosis not present

## 2017-05-17 DIAGNOSIS — Z3A39 39 weeks gestation of pregnancy: Secondary | ICD-10-CM | POA: Diagnosis not present

## 2017-05-17 DIAGNOSIS — O34211 Maternal care for low transverse scar from previous cesarean delivery: Secondary | ICD-10-CM | POA: Diagnosis not present

## 2017-05-17 DIAGNOSIS — O43813 Placental infarction, third trimester: Secondary | ICD-10-CM | POA: Diagnosis not present

## 2017-05-17 LAB — TYPE AND SCREEN
ABO/RH(D): A POS
Antibody Screen: NEGATIVE

## 2017-05-17 LAB — CBC
HCT: 33.9 % — ABNORMAL LOW (ref 36.0–46.0)
Hemoglobin: 10.7 g/dL — ABNORMAL LOW (ref 12.0–15.0)
MCH: 27 pg (ref 26.0–34.0)
MCHC: 31.6 g/dL (ref 30.0–36.0)
MCV: 85.4 fL (ref 78.0–100.0)
Platelets: 391 10*3/uL (ref 150–400)
RBC: 3.97 MIL/uL (ref 3.87–5.11)
RDW: 16.2 % — ABNORMAL HIGH (ref 11.5–15.5)
WBC: 10.6 10*3/uL — ABNORMAL HIGH (ref 4.0–10.5)

## 2017-05-17 LAB — RAPID HIV SCREEN (HIV 1/2 AB+AG)
HIV 1/2 Antibodies: NONREACTIVE
HIV-1 P24 Antigen - HIV24: NONREACTIVE

## 2017-05-17 NOTE — Patient Instructions (Signed)
[place]  05/17/2017   Your procedure is scheduled on:  05/18/2017  Enter through the Main Entrance of [facility] [place] up the phone at the desk and dial [phone].   Call this number if you have problems the morning of surgery: [phone]   Remember:   Do not eat food:After Midnight.  Do not drink clear liquids: 6 Hours before arrival.  Take these medicines the morning of surgery with A SIP OF WATER: take your synthroid and prozac if you take them in the morning regularly   Do not wear jewelry, make-up or nail polish.  Do not wear lotions, powders, or perfumes. Do not wear deodorant.  Do not shave 48 hours prior to surgery.  Do not bring valuables to the hospital.  [facility] is not   responsible for any belongings or valuables brought to the hospital.  Contacts, dentures or bridgework may not be worn into surgery.  Leave suitcase in the car. After surgery it may be brought to your room.  For patients admitted to the hospital, checkout time is 11:00 AM the day of              discharge.   Patients discharged the day of surgery will not be allowed to drive             home.  Name and phone number of your driver: na  Special Instructions:   N/A   Please read over the following fact sheets that you were given:   Surgical Site Infection Prevention

## 2017-05-18 ENCOUNTER — Inpatient Hospital Stay (HOSPITAL_COMMUNITY)
Admission: RE | Admit: 2017-05-18 | Discharge: 2017-05-20 | DRG: 766 | Disposition: A | Discharge status: Home / Self Care | Payer: BLUE CROSS/BLUE SHIELD | Source: Ambulatory Visit | Attending: Obstetrics & Gynecology | Admitting: Obstetrics & Gynecology

## 2017-05-18 ENCOUNTER — Encounter (HOSPITAL_COMMUNITY)
Admission: RE | Disposition: A | Discharge status: Home / Self Care | Payer: Self-pay | Source: Ambulatory Visit | Attending: Obstetrics & Gynecology

## 2017-05-18 ENCOUNTER — Encounter (HOSPITAL_COMMUNITY): Payer: Self-pay | Admitting: *Deleted

## 2017-05-18 ENCOUNTER — Inpatient Hospital Stay (HOSPITAL_COMMUNITY): Payer: BLUE CROSS/BLUE SHIELD | Admitting: Certified Registered Nurse Anesthetist

## 2017-05-18 DIAGNOSIS — Z302 Encounter for sterilization: Secondary | ICD-10-CM

## 2017-05-18 DIAGNOSIS — E039 Hypothyroidism, unspecified: Secondary | ICD-10-CM

## 2017-05-18 DIAGNOSIS — G8929 Other chronic pain: Secondary | ICD-10-CM

## 2017-05-18 DIAGNOSIS — O34211 Maternal care for low transverse scar from previous cesarean delivery: Secondary | ICD-10-CM | POA: Diagnosis not present

## 2017-05-18 DIAGNOSIS — O99284 Endocrine, nutritional and metabolic diseases complicating childbirth: Secondary | ICD-10-CM | POA: Diagnosis present

## 2017-05-18 DIAGNOSIS — Z3A39 39 weeks gestation of pregnancy: Secondary | ICD-10-CM

## 2017-05-18 DIAGNOSIS — R51 Headache: Secondary | ICD-10-CM

## 2017-05-18 DIAGNOSIS — Z3483 Encounter for supervision of other normal pregnancy, third trimester: Secondary | ICD-10-CM

## 2017-05-18 DIAGNOSIS — O43813 Placental infarction, third trimester: Secondary | ICD-10-CM | POA: Diagnosis not present

## 2017-05-18 DIAGNOSIS — N62 Hypertrophy of breast: Secondary | ICD-10-CM

## 2017-05-18 DIAGNOSIS — R519 Headache, unspecified: Secondary | ICD-10-CM

## 2017-05-18 DIAGNOSIS — F419 Anxiety disorder, unspecified: Secondary | ICD-10-CM

## 2017-05-18 DIAGNOSIS — Z3A00 Weeks of gestation of pregnancy not specified: Secondary | ICD-10-CM | POA: Diagnosis not present

## 2017-05-18 DIAGNOSIS — Z98891 History of uterine scar from previous surgery: Secondary | ICD-10-CM

## 2017-05-18 DIAGNOSIS — O34219 Maternal care for unspecified type scar from previous cesarean delivery: Secondary | ICD-10-CM | POA: Diagnosis not present

## 2017-05-18 LAB — RPR: RPR Ser Ql: NONREACTIVE

## 2017-05-18 SURGERY — Surgical Case
Anesthesia: Spinal | Site: Abdomen | Laterality: Bilateral | Wound class: Clean Contaminated

## 2017-05-18 MED ORDER — DIPHENHYDRAMINE HCL 50 MG/ML IJ SOLN
INTRAMUSCULAR | Status: AC
  Filled 2017-05-18: qty 1

## 2017-05-18 MED ORDER — BUPIVACAINE IN DEXTROSE 0.75-8.25 % IT SOLN
INTRATHECAL | Status: AC
  Filled 2017-05-18: qty 2

## 2017-05-18 MED ORDER — SODIUM CHLORIDE 0.9 % IR SOLN
Status: DC | PRN
  Administered 2017-05-18: 1000 mL

## 2017-05-18 MED ORDER — ACETAMINOPHEN 325 MG PO TABS: 650.0000 mg | ORAL_TABLET | ORAL | Status: DC | PRN

## 2017-05-18 MED ORDER — SCOPOLAMINE 1 MG/3DAYS TD PT72
MEDICATED_PATCH | TRANSDERMAL | Status: AC
  Filled 2017-05-18: qty 1

## 2017-05-18 MED ORDER — COCONUT OIL OIL
1.0000 "application " | TOPICAL_OIL | Status: DC | PRN
  Administered 2017-05-20: 1 via TOPICAL
  Filled 2017-05-18: qty 120

## 2017-05-18 MED ORDER — LEVOTHYROXINE SODIUM 100 MCG PO TABS
100.0000 ug | ORAL_TABLET | Freq: Every day | ORAL | Status: DC
  Administered 2017-05-19 – 2017-05-20 (×2): 100 ug via ORAL
  Filled 2017-05-18 (×3): qty 1

## 2017-05-18 MED ORDER — MENTHOL 3 MG MT LOZG: 1.0000 | LOZENGE | OROMUCOSAL | Status: DC | PRN

## 2017-05-18 MED ORDER — PHENYLEPHRINE 40 MCG/ML (10ML) SYRINGE FOR IV PUSH (FOR BLOOD PRESSURE SUPPORT)
PREFILLED_SYRINGE | INTRAVENOUS | Status: AC
  Filled 2017-05-18: qty 10

## 2017-05-18 MED ORDER — SCOPOLAMINE 1 MG/3DAYS TD PT72
MEDICATED_PATCH | TRANSDERMAL | Status: DC | PRN
  Administered 2017-05-18: 1 via TRANSDERMAL

## 2017-05-18 MED ORDER — BUPIVACAINE IN DEXTROSE 0.75-8.25 % IT SOLN
INTRATHECAL | Status: DC | PRN
  Administered 2017-05-18: 1.8 mL via INTRATHECAL

## 2017-05-18 MED ORDER — SIMETHICONE 80 MG PO CHEW
80.0000 mg | CHEWABLE_TABLET | ORAL | Status: DC | PRN
Start: 2017-05-18 — End: 2017-05-20

## 2017-05-18 MED ORDER — DIPHENHYDRAMINE HCL 25 MG PO CAPS: 25.0000 mg | ORAL_CAPSULE | Freq: Four times a day (QID) | ORAL | Status: DC | PRN

## 2017-05-18 MED ORDER — PHENYLEPHRINE 8 MG IN D5W 100 ML (0.08MG/ML) PREMIX OPTIME
INJECTION | INTRAVENOUS | Status: AC
  Filled 2017-05-18: qty 100

## 2017-05-18 MED ORDER — METHYLERGONOVINE MALEATE 0.2 MG PO TABS: 0.2000 mg | ORAL_TABLET | ORAL | Status: DC | PRN

## 2017-05-18 MED ORDER — LACTATED RINGERS IV SOLN
INTRAVENOUS | Status: DC
  Administered 2017-05-19: via INTRAVENOUS

## 2017-05-18 MED ORDER — SIMETHICONE 80 MG PO CHEW
80.0000 mg | CHEWABLE_TABLET | ORAL | Status: DC
  Administered 2017-05-18 – 2017-05-19 (×2): 80 mg via ORAL
  Filled 2017-05-18 (×2): qty 1

## 2017-05-18 MED ORDER — PHENYLEPHRINE HCL 10 MG/ML IJ SOLN
INTRAMUSCULAR | Status: DC | PRN
  Administered 2017-05-18 (×2): 40 ug via INTRAVENOUS
  Administered 2017-05-18 (×2): 80 ug via INTRAVENOUS
  Administered 2017-05-18 (×2): 40 ug via INTRAVENOUS
  Administered 2017-05-18 (×3): 80 ug via INTRAVENOUS
  Administered 2017-05-18: 40 ug via INTRAVENOUS
  Administered 2017-05-18 (×3): 80 ug via INTRAVENOUS
  Administered 2017-05-18: 120 ug via INTRAVENOUS
  Administered 2017-05-18: 80 ug via INTRAVENOUS

## 2017-05-18 MED ORDER — PRENATAL MULTIVITAMIN CH
1.0000 | ORAL_TABLET | Freq: Every day | ORAL | Status: DC
  Administered 2017-05-19 – 2017-05-20 (×2): 1 via ORAL
  Filled 2017-05-18 (×2): qty 1

## 2017-05-18 MED ORDER — ONDANSETRON HCL 4 MG/2ML IJ SOLN
INTRAMUSCULAR | Status: AC
  Filled 2017-05-18: qty 2

## 2017-05-18 MED ORDER — TETANUS-DIPHTH-ACELL PERTUSSIS 5-2.5-18.5 LF-MCG/0.5 IM SUSP: 0.5000 mL | Freq: Once | INTRAMUSCULAR | Status: DC

## 2017-05-18 MED ORDER — SCOPOLAMINE 1 MG/3DAYS TD PT72
MEDICATED_PATCH | TRANSDERMAL | Status: AC
Start: 2017-05-18 — End: ?
  Filled 2017-05-18: qty 1

## 2017-05-18 MED ORDER — METOCLOPRAMIDE HCL 5 MG/ML IJ SOLN
INTRAMUSCULAR | Status: AC
  Filled 2017-05-18: qty 2

## 2017-05-18 MED ORDER — OXYCODONE-ACETAMINOPHEN 5-325 MG PO TABS
1.0000 | ORAL_TABLET | ORAL | Status: DC | PRN
  Administered 2017-05-19 – 2017-05-20 (×5): 1 via ORAL
  Filled 2017-05-18 (×4): qty 1

## 2017-05-18 MED ORDER — MORPHINE SULFATE (PF) 0.5 MG/ML IJ SOLN
INTRAMUSCULAR | Status: DC | PRN
  Administered 2017-05-18: .2 mg via INTRATHECAL

## 2017-05-18 MED ORDER — ONDANSETRON HCL 4 MG/2ML IJ SOLN: 4.0000 mg | Freq: Once | INTRAMUSCULAR | Status: DC | PRN

## 2017-05-18 MED ORDER — MEPERIDINE HCL 25 MG/ML IJ SOLN: 6.2500 mg | INTRAMUSCULAR | Status: DC | PRN

## 2017-05-18 MED ORDER — FENTANYL CITRATE (PF) 100 MCG/2ML IJ SOLN
INTRAMUSCULAR | Status: AC
  Filled 2017-05-18: qty 2

## 2017-05-18 MED ORDER — DIPHENHYDRAMINE HCL 50 MG/ML IJ SOLN
INTRAMUSCULAR | Status: DC | PRN
  Administered 2017-05-18: 25 mg via INTRAVENOUS

## 2017-05-18 MED ORDER — SIMETHICONE 80 MG PO CHEW
80.0000 mg | CHEWABLE_TABLET | Freq: Three times a day (TID) | ORAL | Status: DC
  Administered 2017-05-19 – 2017-05-20 (×4): 80 mg via ORAL
  Filled 2017-05-18 (×5): qty 1

## 2017-05-18 MED ORDER — DEXAMETHASONE SODIUM PHOSPHATE 4 MG/ML IJ SOLN
INTRAMUSCULAR | Status: DC | PRN
  Administered 2017-05-18: 10 mg via INTRAVENOUS

## 2017-05-18 MED ORDER — OXYTOCIN 40 UNITS IN LACTATED RINGERS INFUSION - SIMPLE MED: 2.5000 [IU]/h | INTRAVENOUS | Status: AC

## 2017-05-18 MED ORDER — CEFAZOLIN SODIUM-DEXTROSE 2-4 GM/100ML-% IV SOLN
2.0000 g | INTRAVENOUS | Status: AC
  Administered 2017-05-18: 2 g via INTRAVENOUS
  Filled 2017-05-18: qty 100

## 2017-05-18 MED ORDER — PHENYLEPHRINE 8 MG IN D5W 100 ML (0.08MG/ML) PREMIX OPTIME
INJECTION | INTRAVENOUS | Status: DC | PRN
  Administered 2017-05-18: 60 ug/min via INTRAVENOUS

## 2017-05-18 MED ORDER — IBUPROFEN 600 MG PO TABS
600.0000 mg | ORAL_TABLET | Freq: Four times a day (QID) | ORAL | Status: DC
  Administered 2017-05-18 – 2017-05-20 (×7): 600 mg via ORAL
  Filled 2017-05-18 (×7): qty 1

## 2017-05-18 MED ORDER — FENTANYL CITRATE (PF) 100 MCG/2ML IJ SOLN
25.0000 ug | INTRAMUSCULAR | Status: DC | PRN
  Administered 2017-05-18: 25 ug via INTRAVENOUS
  Administered 2017-05-18: 50 ug via INTRAVENOUS

## 2017-05-18 MED ORDER — OXYTOCIN 40 UNITS IN LACTATED RINGERS INFUSION - SIMPLE MED
INTRAVENOUS | Status: DC | PRN
  Administered 2017-05-18: 40 mL via INTRAVENOUS

## 2017-05-18 MED ORDER — METOCLOPRAMIDE HCL 5 MG/ML IJ SOLN
INTRAMUSCULAR | Status: DC | PRN
  Administered 2017-05-18: 10 mg via INTRAVENOUS

## 2017-05-18 MED ORDER — FLUOXETINE HCL 20 MG PO CAPS
40.0000 mg | ORAL_CAPSULE | Freq: Every day | ORAL | Status: DC
  Administered 2017-05-19 – 2017-05-20 (×2): 40 mg via ORAL
  Filled 2017-05-18 (×3): qty 2

## 2017-05-18 MED ORDER — FLUOXETINE HCL 40 MG PO CAPS: 40.0000 mg | ORAL_CAPSULE | Freq: Every day | ORAL | Status: DC

## 2017-05-18 MED ORDER — LACTATED RINGERS IV SOLN
INTRAVENOUS | Status: DC | PRN
  Administered 2017-05-18: 16:00:00 via INTRAVENOUS

## 2017-05-18 MED ORDER — WITCH HAZEL-GLYCERIN EX PADS: 1.0000 "application " | MEDICATED_PAD | CUTANEOUS | Status: DC | PRN

## 2017-05-18 MED ORDER — LACTATED RINGERS IV SOLN: INTRAVENOUS | Status: DC | PRN

## 2017-05-18 MED ORDER — NALBUPHINE HCL 10 MG/ML IJ SOLN
INTRAMUSCULAR | Status: AC
  Filled 2017-05-18: qty 1

## 2017-05-18 MED ORDER — OXYCODONE-ACETAMINOPHEN 5-325 MG PO TABS
2.0000 | ORAL_TABLET | ORAL | Status: DC | PRN
  Administered 2017-05-19 (×3): 2 via ORAL
  Filled 2017-05-18 (×4): qty 2

## 2017-05-18 MED ORDER — OXYTOCIN 10 UNIT/ML IJ SOLN
INTRAMUSCULAR | Status: AC
  Filled 2017-05-18: qty 4

## 2017-05-18 MED ORDER — SENNOSIDES-DOCUSATE SODIUM 8.6-50 MG PO TABS
2.0000 | ORAL_TABLET | ORAL | Status: DC
  Administered 2017-05-18 – 2017-05-19 (×2): 2 via ORAL
  Filled 2017-05-18 (×2): qty 2

## 2017-05-18 MED ORDER — ZOLPIDEM TARTRATE 5 MG PO TABS: 5.0000 mg | ORAL_TABLET | Freq: Every evening | ORAL | Status: DC | PRN

## 2017-05-18 MED ORDER — DEXAMETHASONE SODIUM PHOSPHATE 4 MG/ML IJ SOLN
INTRAMUSCULAR | Status: AC
  Filled 2017-05-18: qty 1

## 2017-05-18 MED ORDER — MORPHINE SULFATE (PF) 0.5 MG/ML IJ SOLN
INTRAMUSCULAR | Status: AC
  Filled 2017-05-18: qty 10

## 2017-05-18 MED ORDER — LACTATED RINGERS IV SOLN
INTRAVENOUS | Status: DC | PRN
  Administered 2017-05-18 (×3): via INTRAVENOUS

## 2017-05-18 MED ORDER — METHYLERGONOVINE MALEATE 0.2 MG/ML IJ SOLN: 0.2000 mg | INTRAMUSCULAR | Status: DC | PRN

## 2017-05-18 MED ORDER — DIBUCAINE 1 % RE OINT: 1.0000 "application " | TOPICAL_OINTMENT | RECTAL | Status: DC | PRN

## 2017-05-18 MED ORDER — FENTANYL CITRATE (PF) 100 MCG/2ML IJ SOLN
INTRAMUSCULAR | Status: DC | PRN
  Administered 2017-05-18: 25 ug via INTRATHECAL

## 2017-05-18 MED ORDER — ONDANSETRON HCL 4 MG/2ML IJ SOLN
INTRAMUSCULAR | Status: DC | PRN
  Administered 2017-05-18: 4 mg via INTRAVENOUS

## 2017-05-18 SURGICAL SUPPLY — 33 items
CLAMP CORD UMBIL (MISCELLANEOUS) ×2 IMPLANT
CLOTH BEACON ORANGE TIMEOUT ST (SAFETY) ×2 IMPLANT
DERMABOND ADVANCED (GAUZE/BANDAGES/DRESSINGS) ×2
DERMABOND ADVANCED .7 DNX12 (GAUZE/BANDAGES/DRESSINGS) ×2 IMPLANT
DRSG OPSITE POSTOP 4X10 (GAUZE/BANDAGES/DRESSINGS) ×2 IMPLANT
DURAPREP 26ML APPLICATOR (WOUND CARE) ×4 IMPLANT
ELECT REM PT RETURN 9FT ADLT (ELECTROSURGICAL) ×2
ELECTRODE REM PT RTRN 9FT ADLT (ELECTROSURGICAL) ×1 IMPLANT
GLOVE BIOGEL PI IND STRL 7.0 (GLOVE) ×1 IMPLANT
GLOVE BIOGEL PI IND STRL 8 (GLOVE) ×1 IMPLANT
GLOVE BIOGEL PI INDICATOR 7.0 (GLOVE) ×1
GLOVE BIOGEL PI INDICATOR 8 (GLOVE) ×1
GLOVE ECLIPSE 8.0 STRL XLNG CF (GLOVE) ×2 IMPLANT
GOWN STRL REUS W/TWL LRG LVL3 (GOWN DISPOSABLE) ×4 IMPLANT
KIT ABG SYR 3ML LUER SLIP (SYRINGE) ×2 IMPLANT
NEEDLE HYPO 18GX1.5 BLUNT FILL (NEEDLE) ×2 IMPLANT
NEEDLE HYPO 22GX1.5 SAFETY (NEEDLE) ×2 IMPLANT
NEEDLE HYPO 25X5/8 SAFETYGLIDE (NEEDLE) ×2 IMPLANT
NS IRRIG 1000ML POUR BTL (IV SOLUTION) ×2 IMPLANT
PACK C SECTION WH (CUSTOM PROCEDURE TRAY) ×2 IMPLANT
PAD OB MATERNITY 4.3X12.25 (PERSONAL CARE ITEMS) ×2 IMPLANT
PENCIL SMOKE EVAC W/HOLSTER (ELECTROSURGICAL) ×2 IMPLANT
SUT CHROMIC 0 CT 1 (SUTURE) ×2 IMPLANT
SUT MNCRL 0 VIOLET CTX 36 (SUTURE) ×2 IMPLANT
SUT MONOCRYL 0 CTX 36 (SUTURE) ×2
SUT PLAIN 2 0 (SUTURE) ×3
SUT PLAIN ABS 2-0 CT1 27XMFL (SUTURE) ×3 IMPLANT
SUT VIC AB 0 CTX 36 (SUTURE) ×1
SUT VIC AB 0 CTX36XBRD ANBCTRL (SUTURE) ×1 IMPLANT
SUT VIC AB 4-0 KS 27 (SUTURE) ×2 IMPLANT
SYR 20CC LL (SYRINGE) ×4 IMPLANT
TOWEL OR 17X24 6PK STRL BLUE (TOWEL DISPOSABLE) ×2 IMPLANT
TRAY FOLEY BAG SILVER LF 14FR (SET/KITS/TRAYS/PACK) ×2 IMPLANT

## 2017-05-18 NOTE — Anesthesia Procedure Notes (Signed)
Spinal  Patient location during procedure: OR Start time: 05/18/2017 3:05 PM End time: 05/18/2017 3:06 PM Staffing Anesthesiologist: Janeece Riggers Performed: other anesthesia staff  Preanesthetic Checklist Completed: patient identified, site marked, surgical consent, pre-op evaluation, timeout performed, IV checked, risks and benefits discussed and monitors and equipment checked Spinal Block Patient position: sitting Prep: DuraPrep Patient monitoring: heart rate, continuous pulse ox and blood pressure Approach: midline Location: L2-3 Injection technique: single-shot Needle Needle type: Pencan  Needle gauge: 24 G Needle length: 10 cm Assessment Sensory level: T4

## 2017-05-18 NOTE — Transfer of Care (Signed)
Immediate Anesthesia Transfer of Care Note  Patient: Erica Farley  Procedure(s) Performed: Procedure(s): CESAREAN SECTION WITH BILATERAL TUBAL LIGATION (Bilateral)  Patient Location: PACU  Anesthesia Type:Spinal  Level of Consciousness: awake, alert  and oriented  Airway & Oxygen Therapy: Patient Spontanous Breathing  Post-op Assessment: Report given to RN and Post -op Vital signs reviewed and stable  Post vital signs: Reviewed and stable  Last Vitals:  Vitals:   05/18/17 1322  BP: 118/76  Pulse: 82  Resp: 16  Temp: 36.6 C    Last Pain:  Vitals:   05/18/17 1322  TempSrc: Oral         Complications: No apparent anesthesia complications

## 2017-05-18 NOTE — Op Note (Signed)
Preoperative diagnosis:  1.  Intrauterine pregnancy at [redacted]w[redacted]d  weeks gestation                                         2.  Previous Caesarean section, declines TOL                                         3.  Desires sterilization   Postoperative diagnosis:  Same as above   Procedure:  Repeat cesarean section with bilateral tubal ligation, modified Pomeroy technique  Surgeon:  Florian Buff MD  Assistant:  Dr Degele  Anesthesia: Spinal  Findings:  .    Over a low transverse incision was delivered a viable female with Apgars of 7 and 7 and 9 weighing penidng lbs.  oz. Uterus, tubes and ovaries were all normal.  There were no other significant findings  Description of operation:  Patient was taken to the operating room and placed in the sitting position where she underwent a spinal anesthetic. She was then placed in the supine position with tilt to the left side. When adequate anesthetic level was obtained she was prepped and draped in usual sterile fashion and a Foley catheter was placed. A Pfannenstiel skin incision was made and carried down sharply to the rectus fascia which was scored in the midline extended laterally. The fascia was taken off the muscles both superiorly and without difficulty. The muscles were divided.  The peritoneal cavity was entered.  Bladder blade was placed, no bladder flap was created.  A low transverse hysterotomy incision was made and delivered a viable female  infant at 61 with Apgars of 7 and 7 and 9 weighing lbs  oz.  Cord pH was obtained and was pending. The uterus was exteriorized. It was closed in 1 layers, the first being a running interlocking layer and the second being an imbricating layer using 0 monocryl on a CTX needle. There was good resulting hemostasis. A modified Pomeroy bilateral tubal ligation was performed in the usual fashion. 1 2.5 cm segment was taken bilaterally.  The uterus tubes and ovaries were all normal. Peritoneal cavity was irrigated  vigorously. The muscles and peritoneum were reapproximated loosely. The fascia was closed using 0 Vicryl in running fashion. Subcutaneous tissue was made hemostatic and irrigated. The skin was closed using 4-0 Vicryl on a Keith needle in a subcuticular fashion.  Dermabond was placed for additional wound integrity and to serve as a barrier. Blood loss for the procedure was 650 cc. The patient received 2 gram of Ancef prophylactically. The patient was taken to the recovery room in good stable condition with all counts being correct x3.  EBL 650 cc  Elliett Guarisco H 05/18/2017 4:10 PM

## 2017-05-18 NOTE — Anesthesia Preprocedure Evaluation (Addendum)
Anesthesia Evaluation  Patient identified by MRN, date of birth, ID band Patient awake    Reviewed: Allergy & Precautions, H&P , NPO status , Patient's Chart, lab work & pertinent test results, reviewed documented beta blocker date and time   Airway Mallampati: III  TM Distance: >3 FB Neck ROM: full    Dental no notable dental hx.    Pulmonary neg pulmonary ROS,    Pulmonary exam normal breath sounds clear to auscultation       Cardiovascular negative cardio ROS Normal cardiovascular exam Rhythm:regular Rate:Normal     Neuro/Psych  Headaches, negative neurological ROS  negative psych ROS   GI/Hepatic negative GI ROS, Neg liver ROS,   Endo/Other  negative endocrine ROSHypothyroidism   Renal/GU negative Renal ROS  negative genitourinary   Musculoskeletal   Abdominal   Peds  Hematology negative hematology ROS (+)   Anesthesia Other Findings   Reproductive/Obstetrics (+) Pregnancy                            Anesthesia Physical Anesthesia Plan  ASA: II  Anesthesia Plan: Spinal   Post-op Pain Management:    Induction:   PONV Risk Score and Plan: 2 and Ondansetron, Dexamethasone and Treatment may vary due to age or medical condition  Airway Management Planned:   Additional Equipment:   Intra-op Plan:   Post-operative Plan:   Informed Consent: I have reviewed the patients History and Physical, chart, labs and discussed the procedure including the risks, benefits and alternatives for the proposed anesthesia with the patient or authorized representative who has indicated his/her understanding and acceptance.   Dental advisory given  Plan Discussed with:   Anesthesia Plan Comments:        Anesthesia Quick Evaluation

## 2017-05-18 NOTE — H&P (Signed)
Erica Farley is a 31 y.o. female presenting for repeat Caesarean section with bilateral tubal ligation Previous C section for breech declines trial of labor Desires permanent sterilization. OB History    Gravida Para Term Preterm AB Living   2 1 1     1    SAB TAB Ectopic Multiple Live Births           1     Past Medical History:  Diagnosis Date  . Anxiety   . Body aches 02/09/2016  . Chronic headaches 07/23/2014  . Contraceptive management 07/23/2014  . Depression 02/09/2016  . Headache(784.0)    due to TMJ- left side   . Hypothyroid 02/10/2016  . Large breasts 09/06/2014  . Menstrual headache 03/06/2015  . Neck pain, bilateral posterior 09/06/2014  . Pain in hand 03/06/2015   Past Surgical History:  Procedure Laterality Date  . bladder stem     . CESAREAN SECTION  11/16/2012   Procedure: CESAREAN SECTION;  Surgeon: Florian Buff, MD;  Location: [facility];  Service: Obstetrics;  Laterality: N/A;  . WISDOM TOOTH EXTRACTION     Family History: family history includes Bipolar disorder in her paternal grandmother; COPD in her maternal grandfather; Cancer in her paternal grandfather; Depression in her maternal grandmother; Diabetes in her paternal grandmother; Fibromyalgia in her maternal grandmother and mother; Suicidality in her paternal aunt. Social History:  reports that she has never smoked. She has never used smokeless tobacco. She reports that she does not drink alcohol or use drugs.     Maternal Diabetes: No Genetic Screening: Declined Maternal Ultrasounds/Referrals: Normal Fetal Ultrasounds or other Referrals:  None Maternal Substance Abuse:  No Significant Maternal Medications:  None Significant Maternal Lab Results:  None Other Comments:    ROS   Review of Systems  Constitutional: Negative for fever, chills, weight loss, malaise/fatigue and diaphoresis.  HENT: Negative for hearing loss, ear pain, nosebleeds, congestion, sore throat, neck pain, tinnitus and ear  discharge.   Eyes: Negative for blurred vision, double vision, photophobia, pain, discharge and redness.  Respiratory: Negative for cough, hemoptysis, sputum production, shortness of breath, wheezing and stridor.   Cardiovascular: Negative for chest pain, palpitations, orthopnea, claudication, leg swelling and PND.  Gastrointestinal: nageative for abdominal pain. Negative for heartburn, nausea, vomiting, diarrhea, constipation, blood in stool and melena.  Genitourinary: Negative for dysuria, urgency, frequency, hematuria and flank pain.  Musculoskeletal: Negative for myalgias, back pain, joint pain and falls.  Skin: Negative for itching and rash.  Neurological: Negative for dizziness, tingling, tremors, sensory change, speech change, focal weakness, seizures, loss of consciousness, weakness and headaches.  Endo/Heme/Allergies: Negative for environmental allergies and polydipsia. Does not bruise/bleed easily.  Psychiatric/Behavioral: Negative for depression, suicidal ideas, hallucinations, memory loss and substance abuse. The patient is not nervous/anxious and does not have insomnia.        History  Past Medical History:  Diagnosis Date  . Anxiety   . Body aches 02/09/2016  . Chronic headaches 07/23/2014  . Contraceptive management 07/23/2014  . Depression 02/09/2016  . Headache(784.0)    due to TMJ- left side   . Hypothyroid 02/10/2016  . Large breasts 09/06/2014  . Menstrual headache 03/06/2015  . Neck pain, bilateral posterior 09/06/2014  . Pain in hand 03/06/2015    Past Surgical History:  Procedure Laterality Date  . bladder stem     . CESAREAN SECTION  11/16/2012   Procedure: CESAREAN SECTION;  Surgeon: Florian Buff, MD;  Location: [facility];  Service:  Obstetrics;  Laterality: N/A;  . WISDOM TOOTH EXTRACTION      OB History    Gravida Para Term Preterm AB Living   2 1 1     1    SAB TAB Ectopic Multiple Live Births           1      No Known Allergies  Social History    Social History  . Marital status: Married    Spouse name: N/A  . Number of children: N/A  . Years of education: N/A   Social History Main Topics  . Smoking status: Never Smoker  . Smokeless tobacco: Never Used  . Alcohol use No  . Drug use: No  . Sexual activity: Yes    Birth control/ protection: None   Other Topics Concern  . None   Social History Narrative  . None    Family History  Problem Relation Age of Onset  . Depression Maternal Grandmother   . Fibromyalgia Maternal Grandmother   . Cancer Paternal Grandfather        lung  . Fibromyalgia Mother   . COPD Maternal Grandfather   . Bipolar disorder Paternal Grandmother   . Diabetes Paternal Grandmother   . Suicidality Paternal Aunt       Blood pressure 118/76, pulse 82, temperature 97.9 F (36.6 C), temperature source Oral, resp. rate 16, height 5\' 5"  (1.651 m), weight 198 lb (89.8 kg), last menstrual period 08/12/2016. Exam Physical Exam  Physical Exam  Vitals reviewed. Constitutional: She is oriented to person, place, and time. She appears well-developed and well-nourished.  HENT:  Head: Normocephalic and atraumatic.  Right Ear: External ear normal.  Left Ear: External ear normal.  Nose: Nose normal.  Mouth/Throat: Oropharynx is clear and moist.  Eyes: Conjunctivae and EOM are normal. Pupils are equal, round, and reactive to light. Right eye exhibits no discharge. Left eye exhibits no discharge. No scleral icterus.  Neck: Normal range of motion. Neck supple. No tracheal deviation present. No thyromegaly present.  Cardiovascular: Normal rate, regular rhythm, normal heart sounds and intact distal pulses.  Exam reveals no gallop and no friction rub.   No murmur heard. Respiratory: Effort normal and breath sounds normal. No respiratory distress. She has no wheezes. She has no rales. She exhibits no tenderness.  GI: Soft. Bowel sounds are normal. She exhibits no distension and no mass. There is tenderness.  There is no rebound and no guarding.  Genitourinary:       Vulva is normal without lesions Vagina is pink moist without discharge Cervix normal in appearance and pap is normal Uterus is size equals dates Adnexa is negative with normal sized ovaries by sonogram  Musculoskeletal: Normal range of motion. She exhibits no edema and no tenderness.  Neurological: She is alert and oriented to person, place, and time. She has normal reflexes. She displays normal reflexes. No cranial nerve deficit. She exhibits normal muscle tone. Coordination normal.  Skin: Skin is warm and dry. No rash noted. No erythema. No pallor.  Psychiatric: She has a normal mood and affect. Her behavior is normal. Judgment and thought content normal.    Prenatal labs: ABO, Rh: --/--/A POS (07/31 1100) Antibody: NEG (07/31 1100) Rubella: 9.44 (01/10 1523) RPR: Non Reactive (07/31 1100)  HBsAg: Negative (01/10 1523)  ERX:[id number]    GBS: Negative (07/19 1130)   Assessment/Plan: [redacted]w[redacted]d Estimated Date of Delivery: 05/25/17  previous Caesarean section, declines trial of labor  Desires permanent sterilization  Repeat Caesarean section  with bilateral tubal ligation  Rani Sisney H 05/18/2017, 2:42 PM

## 2017-05-18 NOTE — Anesthesia Postprocedure Evaluation (Signed)
Anesthesia Post Note  Patient: Erica Farley  Procedure(s) Performed: Procedure(s) (LRB): CESAREAN SECTION WITH BILATERAL TUBAL LIGATION (Bilateral)     Patient location during evaluation: PACU Anesthesia Type: Spinal Level of consciousness: oriented and awake and alert Pain management: pain level controlled Vital Signs Assessment: post-procedure vital signs reviewed and stable Respiratory status: spontaneous breathing, respiratory function stable and patient connected to nasal cannula oxygen Cardiovascular status: blood pressure returned to baseline and stable Postop Assessment: no headache and no backache Anesthetic complications: no    Last Vitals:  Vitals:   05/18/17 1645 05/18/17 1700  BP: 101/62 (!) 99/55  Pulse: 74 74  Resp: 17 17  Temp:      Last Pain:  Vitals:   05/18/17 1700  TempSrc:   PainSc: 0-No pain   Pain Goal:                 Anaijah Augsburger

## 2017-05-19 DIAGNOSIS — Z3A39 39 weeks gestation of pregnancy: Secondary | ICD-10-CM

## 2017-05-19 DIAGNOSIS — O34211 Maternal care for low transverse scar from previous cesarean delivery: Secondary | ICD-10-CM

## 2017-05-19 LAB — CBC
HCT: 29 % — ABNORMAL LOW (ref 36.0–46.0)
Hemoglobin: 9.1 g/dL — ABNORMAL LOW (ref 12.0–15.0)
MCH: 26.8 pg (ref 26.0–34.0)
MCHC: 31.4 g/dL (ref 30.0–36.0)
MCV: 85.5 fL (ref 78.0–100.0)
Platelets: 337 10*3/uL (ref 150–400)
RBC: 3.39 MIL/uL — ABNORMAL LOW (ref 3.87–5.11)
RDW: 16.3 % — ABNORMAL HIGH (ref 11.5–15.5)
WBC: 14.1 10*3/uL — ABNORMAL HIGH (ref 4.0–10.5)

## 2017-05-19 NOTE — Anesthesia Postprocedure Evaluation (Signed)
Anesthesia Post Note  Patient: Erica Farley  Procedure(s) Performed: Procedure(s) (LRB): CESAREAN SECTION WITH BILATERAL TUBAL LIGATION (Bilateral)     Patient location during evaluation: Mother Baby Anesthesia Type: Spinal Level of consciousness: oriented and awake and alert Pain management: pain level controlled Vital Signs Assessment: post-procedure vital signs reviewed and stable Respiratory status: spontaneous breathing, respiratory function stable and patient connected to nasal cannula oxygen Cardiovascular status: blood pressure returned to baseline and stable Postop Assessment: no headache and no backache Anesthetic complications: no    Last Vitals:  Vitals:   05/18/17 2100 05/19/17 0521  BP: 106/71 100/63  Pulse: 77 66  Resp: 18 16  Temp: 36.8 C     Last Pain:  Vitals:   05/19/17 0743  TempSrc:   PainSc: 6    Pain Goal:                 [employer]

## 2017-05-19 NOTE — Progress Notes (Signed)
I have seen this patient and agree with the plan.

## 2017-05-19 NOTE — Lactation Note (Addendum)
This note was copied from a baby's chart. Lactation Consultation Note Mom has 31 yr old that she BF/Formula fed. Mom stated she had latching issues. Mom gave formula d/t difficulty BF and baby fussy she didn't think she had enough BM. Mom has LONG pendulum breast. Most breast tissue mid to lower aspect of breast. Rt. Nipple Small and flat. Mom stated it was inverted w/her 1st child. Lt. Nipple everted short shaft. Both areolas and nipples very compressible. Discussed position options. Mom stated she likes football. Baby had just BF resting in crib.  Shells given to assist nipples to evert more. Stressed importance of a proper fitting bra and to wear one. Hand pump given to pre-pump Rt. Nipple and roll in finger tips before latching.  Hand expression taught w/colostrum noted. Offered to set up DEBP. Mom stated she had hers and would use if she choose to,. Newborn behavior and feeding habits reviewed as well as STS, I&O, cluster feeding, supply and demand.  Patient Name: Boy Salimah Martinovich SMOLM'B Date: 05/19/2017 Reason for consult: Initial assessment   Maternal Data Has patient been taught Hand Expression?: Yes Does the patient have breastfeeding experience prior to this delivery?: Yes  Feeding    LATCH Score       Type of Nipple: Flat (Rt. nipple/ Lt nipple short shaft everted)  Comfort (Breast/Nipple): Soft / non-tender        Interventions Interventions: Breast feeding basics reviewed;Hand express;Support pillows;Position options;Shells;Hand pump  Lactation Tools Discussed/Used Tools: Shells;Pump Shell Type: Inverted Breast pump type: Manual WIC Program: No Pump Review: Setup, frequency, and cleaning;Milk Storage Initiated by:: Allayne Stack RN IBCLC Date initiated:: 05/19/17   Consult Status Consult Status: Follow-up Date: 05/19/17 Follow-up type: In-patient    Ralston Venus, Elta Guadeloupe 05/19/2017, 1:40 AM

## 2017-05-19 NOTE — Addendum Note (Signed)
Addendum  created 05/19/17 0817 by Alvy Bimler, CRNA   Sign clinical note

## 2017-05-19 NOTE — Progress Notes (Signed)
POSTPARTUM PROGRESS NOTE  Post Op day 1 Subjective:  Erica Farley is a 31 y.o. [id number] [redacted]w[redacted]d s/p cesarean .  No acute events overnight.  Pt denies problems with ambulating, voiding or po intake.  She denies nausea or vomiting.  Pain is well controlled.  She has not had flatus. She has not had bowel movement.  Lochia Small.   Objective: Blood pressure 100/63, pulse 66, temperature 98.2 F (36.8 C), temperature source Oral, resp. rate 16, height 5\' 5"  (1.651 m), weight 89.8 kg (198 lb), last menstrual period 08/12/2016, SpO2 95 %, unknown if currently breastfeeding.  Physical Exam:  General: alert, cooperative and no distress Lochia:normal flow Chest: CTAB Heart: RRR no m/r/g Abdomen: +BS, soft, nontender,  Uterine Fundus: firm, DVT Evaluation: No calf swelling or tenderness Extremities: no edema Incision clean dry and in tact   Recent Labs  05/17/17 1100 05/19/17 0534  HGB 10.7* 9.1*  HCT 33.9* 29.0*    Assessment/Plan:  ASSESSMENT: Erica Farley is a 31 y.o. [id number] [redacted]w[redacted]d s/p cesarean  Plan for discharge tomorrow   LOS: 1 day   Rodney Booze, Medical Student

## 2017-05-19 NOTE — Progress Notes (Signed)
MOB was referred for history of depression/anxiety. * Referral screened out by Clinical Social Worker because none of the following criteria appear to apply: ~ History of anxiety/depression during this pregnancy, or of post-partum depression. ~ Diagnosis of anxiety and/or depression within last 3 years AND * MOB's symptoms currently being treated with medication and/or therapy. Please contact the Clinical Social Worker if needs arise, by [facility] request, or if MOB scores 9 or greater/yes to question 10 on Edinburgh Postpartum Depression Screen.  MOB has Rx for Prozac.

## 2017-05-19 NOTE — Plan of Care (Signed)
Problem: Pain Management: Goal: General experience of comfort will improve and pain level will decrease Outcome: Progressing Patient taking percocet to help with pain. Also encouraged ambulation to avoid gas pain.

## 2017-05-20 MED ORDER — IBUPROFEN 600 MG PO TABS: 600.0000 mg | ORAL_TABLET | Freq: Four times a day (QID) | ORAL | 0 refills | Status: DC | PRN

## 2017-05-20 MED ORDER — OXYCODONE-ACETAMINOPHEN 5-325 MG PO TABS: 1.0000 | ORAL_TABLET | ORAL | 0 refills | Status: DC | PRN

## 2017-05-20 NOTE — Plan of Care (Signed)
Problem: Pain Management: Goal: General experience of comfort will improve and pain level will decrease Outcome: Progressing Pt continues to report epigastric/inscisional pain. Percocet given as ordered PRN, ambulation continues to be encouraged.

## 2017-05-20 NOTE — Discharge Summary (Signed)
OB Discharge Summary     Patient Name: Erica Farley DOB: 02/15/1986 MRN: [id number]  Date of admission: 05/18/2017 Delivering MD: Tania Ade H   Date of discharge: 05/20/2017  Admitting diagnosis: PREVEOUS C-SECTION, DESIRES STERILIZATION Intrauterine pregnancy: [redacted]w[redacted]d     Secondary diagnosis:  Active Problems:   S/P cesarean section  Additional problems: none     Discharge diagnosis: Term Pregnancy Delivered and BTL                                                                                                Post partum procedures:none  Augmentation: N/A  Complications: None  Hospital course:  Sceduled C/S   31 y.o. yo G2P2002 at [redacted]w[redacted]d was admitted to the hospital 05/18/2017 for scheduled cesarean section with the following indication:Elective Repeat.  Membrane Rupture Time/Date: 3:32 PM ,05/18/2017   Patient delivered a Viable infant.05/18/2017  Details of operation can be found in separate operative note.  Pateint had an uncomplicated postpartum course.  She is ambulating, tolerating a regular diet, passing flatus, and urinating well. Patient is discharged home in stable condition on  05/20/17         Physical exam  Vitals:   05/19/17 0521 05/19/17 0918 05/19/17 1430 05/19/17 1752  BP: 100/63 (!) 100/53 (!) 100/52 (!) 95/54  Pulse: 66 72 82 68  Resp: 16 17 18 18   Temp:  98.4 F (36.9 C) 98.3 F (36.8 C) 97.7 F (36.5 C)  TempSrc:  Oral Oral Oral  SpO2:      Weight:      Height:       General: alert and cooperative Lochia: appropriate Uterine Fundus: firm Incision: honeycomb dry and intact DVT Evaluation: No evidence of DVT seen on physical exam. Labs: Lab Results  Component Value Date   WBC 14.1 (H) 05/19/2017   HGB 9.1 (L) 05/19/2017   HCT 29.0 (L) 05/19/2017   MCV 85.5 05/19/2017   PLT 337 05/19/2017   CMP Latest Ref Rng & Units 05/05/2017  Glucose 65 - 99 mg/dL 83  BUN 6 - 20 mg/dL 8  Creatinine 0.57 - 1.00 mg/dL 0.74  Sodium 134 - 144 mmol/L 136   Potassium 3.5 - 5.2 mmol/L 4.7  Chloride 96 - 106 mmol/L 101  CO2 20 - 29 mmol/L 20  Calcium 8.7 - 10.2 mg/dL 9.4  Total Protein 6.0 - 8.5 g/dL 6.4  Total Bilirubin 0.0 - 1.2 mg/dL 0.5  Alkaline Phos 39 - 117 IU/L 119(H)  AST 0 - 40 IU/L 13  ALT 0 - 32 IU/L 7    Discharge instruction: per After Visit Summary and "Baby and Me Booklet".  After visit meds:  Allergies as of 05/20/2017   No Known Allergies     Medication List    STOP taking these medications   acetaminophen 325 MG tablet Commonly known as:  TYLENOL     TAKE these medications   FLUoxetine 40 MG capsule Commonly known as:  PROZAC TAKE ONE CAPSULE BY MOUTH ONCE DAILY   ibuprofen 600 MG tablet Commonly known as:  ADVIL,MOTRIN Take 1 tablet (600  mg total) by mouth every 6 (six) hours as needed.   levothyroxine 100 MCG tablet Commonly known as:  SYNTHROID, LEVOTHROID Take 1 tablet (100 mcg total) by mouth daily before breakfast.   oxyCODONE-acetaminophen 5-325 MG tablet Commonly known as:  PERCOCET/ROXICET Take 1 tablet by mouth every 4 (four) hours as needed (pain scale 4-7).   PRENATAL VITAMIN PO Take 1 tablet by mouth daily.       Diet: routine diet  Activity: Advance as tolerated. Pelvic rest for 6 weeks.   Outpatient follow up:4 weeks Follow up Appt:Future Appointments Date Time Provider [place]  05/26/2017 10:45 AM Florian Buff, MD [facility]   Follow up Visit:No Follow-up on file.  Postpartum contraception: Tubal Ligation  Newborn Data: Live born female  Birth Weight: 8 lb 9.7 oz (3905 g) APGAR: 7, 7  Baby Feeding: Breast Disposition:home with mother   05/20/2017 Serita Grammes, CNM  9:51 AM

## 2017-05-20 NOTE — Lactation Note (Signed)
This note was copied from a baby's chart. Lactation Consultation Note  Patient Name: Boy Haiven Nardone [id number] Date: 05/20/2017 Reason for consult: Follow-up assessment    With this mom of a term baby, now 1 hours old. Mom states this baby is latching well, and he has had very good output. Mom was pumping when I walked in the room, and her milk has begun to transition in. I showed mom how to position her breast feeding pillow for ease of latching in football hold.  The baby has been cluster feeding, but is being circumcised  this morning, and mom is aware her may be sleepy for a few hours after. Dad is trying to get a baby check appointmnt with their provider for Monday, so mom and baby can be discharged to home today. I gave mom the clinic phone number, in case she wants to make an o/p lactation appointment with this baby. Mom knows to call for questions/conerns.    Maternal Data Formula Feeding for Exclusion: No  Feeding Feeding Type: Breast Fed Length of feed: 15 min  LATCH Score                   Interventions Interventions: Hand express;Coconut oil;Position options  Lactation Tools Discussed/Used Pump Review: Milk Storage   Consult Status Consult Status: PRN Follow-up type: Call as needed    Tonna Corner 05/20/2017, 11:27 AM

## 2017-05-20 NOTE — Lactation Note (Addendum)
This note was copied from a baby's chart. Lactation Consultation Note Mom stated baby is cluster feeding. Hasn't had BM in 24 hours. Had a couple of voids. Hand expressed colostrum easily. Encouraged to breast compressions occasionally during BF and STS. Asked RN to update I&O.  Patient Name: Erica Farley Date: 05/20/2017 Reason for consult: Follow-up assessment   Maternal Data    Feeding    LATCH Score                   Interventions Interventions: Expressed milk;Hand express;Breast massage;Breast compression  Lactation Tools Discussed/Used     Consult Status Consult Status: Follow-up Date: 05/20/17 Follow-up type: In-patient    Erica Farley, Erica Farley 05/20/2017, 1:16 AM

## 2017-05-20 NOTE — Discharge Instructions (Signed)

## 2017-05-23 ENCOUNTER — Telehealth: Payer: Self-pay | Admitting: Advanced Practice Midwife

## 2017-05-23 NOTE — Telephone Encounter (Signed)
Called and husband stated they could not talk at this time, they were waiting for nurse to call for jaundice blanket.Will call back tomorrow.

## 2017-05-26 ENCOUNTER — Encounter: Payer: Self-pay | Admitting: Obstetrics & Gynecology

## 2017-05-26 ENCOUNTER — Ambulatory Visit (INDEPENDENT_AMBULATORY_CARE_PROVIDER_SITE_OTHER): Payer: BLUE CROSS/BLUE SHIELD | Admitting: Obstetrics & Gynecology

## 2017-05-26 VITALS — BP 128/92 | HR 80 | Ht 65.0 in | Wt 181.0 lb

## 2017-05-26 DIAGNOSIS — Z9889 Other specified postprocedural states: Secondary | ICD-10-CM

## 2017-05-26 DIAGNOSIS — Z98891 History of uterine scar from previous surgery: Secondary | ICD-10-CM

## 2017-05-26 NOTE — Progress Notes (Signed)
  HPI: Patient returns for routine postoperative follow-up having undergone repeat section + BTL on 05/18/2017.  The patient's immediate postoperative recovery has been unremarkable. Since hospital discharge the patient reports .   Current Outpatient Prescriptions: FLUoxetine (PROZAC) 40 MG capsule, TAKE ONE CAPSULE BY MOUTH ONCE DAILY, Disp: 90 capsule, Rfl: 3 ibuprofen (ADVIL,MOTRIN) 600 MG tablet, Take 1 tablet (600 mg total) by mouth every 6 (six) hours as needed., Disp: 30 tablet, Rfl: 0 levothyroxine (SYNTHROID, LEVOTHROID) 100 MCG tablet, Take 1 tablet (100 mcg total) by mouth daily before breakfast., Disp: 30 tablet, Rfl: 6 oxyCODONE-acetaminophen (PERCOCET/ROXICET) 5-325 MG tablet, Take 1 tablet by mouth every 4 (four) hours as needed (pain scale 4-7)., Disp: 50 tablet, Rfl: 0 Prenatal Vit-Fe Fumarate-FA (PRENATAL VITAMIN PO), Take 1 tablet by mouth daily. , Disp: , Rfl:   No current facility-administered medications for this visit.     Blood pressure (!) 128/92, pulse 80, height 5\' 5"  (1.651 m), weight 181 lb (82.1 kg), last menstrual period 08/12/2016, currently breastfeeding.  Physical Exam: Normal post   Diagnostic Tests:   Pathology: \  Impression: S/p repeat section with BTL  Plan:   Follow up:  Return in about 4 weeks (around 06/23/2017) for post partum visit.  Florian Buff, MD

## 2017-06-06 ENCOUNTER — Other Ambulatory Visit: Payer: Self-pay | Admitting: Obstetrics & Gynecology

## 2017-06-06 MED ORDER — IBUPROFEN 800 MG PO TABS: 800.0000 mg | ORAL_TABLET | Freq: Three times a day (TID) | ORAL | 1 refills | Status: DC | PRN

## 2017-06-06 MED ORDER — OXYCODONE-ACETAMINOPHEN 5-325 MG PO TABS: 1.0000 | ORAL_TABLET | ORAL | 0 refills | Status: DC | PRN

## 2017-06-23 ENCOUNTER — Telehealth (HOSPITAL_COMMUNITY): Payer: Self-pay

## 2017-06-23 ENCOUNTER — Ambulatory Visit (INDEPENDENT_AMBULATORY_CARE_PROVIDER_SITE_OTHER): Payer: BLUE CROSS/BLUE SHIELD | Admitting: Women's Health

## 2017-06-23 ENCOUNTER — Encounter: Payer: Self-pay | Admitting: Women's Health

## 2017-06-23 DIAGNOSIS — F418 Other specified anxiety disorders: Secondary | ICD-10-CM

## 2017-06-23 DIAGNOSIS — E039 Hypothyroidism, unspecified: Secondary | ICD-10-CM

## 2017-06-23 NOTE — Progress Notes (Signed)
Subjective:    Erica Farley is a 31 y.o. G41P2002 Caucasian female who presents for a postpartum visit. She is 4 weeks postpartum following a repeat cesarean section, low transverse incision and BTL at 39.0 gestational weeks. Anesthesia: spinal. I have fully reviewed the prenatal and intrapartum course. Postpartum course has been uncomplicated. Baby's course has been uncomplicated. Baby is feeding by br/bottle. Bleeding thin lochia. Bowel function is normal. Bladder function is normal. Patient is sexually active. Last sexual activity: recently. Contraception method is tubal ligation. Postpartum depression screening: positive. Score 12. Feels overwhelmed/anxious, more than depressed. Taking prozac 40mg  as rx'd. Interested in counseling. Doesn't want to change meds right now. Last pap 03/29/16 and was neg w/ -HRHPV. Hypothyroidism- on synthroid 174mcg daily  The following portions of the patient's history were reviewed and updated as appropriate: allergies, current medications, past medical history, past surgical history and problem list.  Review of Systems Pertinent items are noted in HPI.   Vitals:   06/23/17 1026  BP: 90/70  Pulse: 80  Weight: 171 lb 6.4 oz (77.7 kg)  Height: 5\' 5"  (1.651 m)   No LMP recorded.  Objective:   General:  alert, cooperative and no distress   Breasts:  deferred, no complaints  Lungs: clear to auscultation bilaterally  Heart:  regular rate and rhythm  Abdomen: soft, nontender, incision well-healed   Vulva: normal  Vagina: normal vagina  Cervix:  closed  Corpus: Well-involuted  Adnexa:  Non-palpable  Rectal Exam: No hemorrhoids        Assessment:   Postpartum exam 4 wks s/p RLTCS w/ BTL Breast & bottle feeding Dep/anx Depression screening Contraception counseling  Hypothyroidism  Plan:  Contraception: tubal ligation Follow up in: 4 weeks for f/u on dep/anx, or earlier if needed Referral to [facility] placed for therapy/counseling Check TSH  today  Tawnya Crook CNM, [facility] 06/23/2017 10:38 AM

## 2017-06-23 NOTE — Patient Instructions (Signed)
Tips To Increase Milk Supply  Lots of water! Enough so that your urine is clear  Plenty of calories, if you're not getting enough calories, your milk supply can decrease  Breastfeed/pump often, every 2-3 hours x 20-30mins  Fenugreek 3 pills 3 times a day, this may make your urine smell like maple syrup  Mother's Milk Tea  Lactation cookies, google for the recipe  Real oatmeal   

## 2017-06-24 LAB — TSH: TSH: 0.068 u[IU]/mL — ABNORMAL LOW (ref 0.450–4.500)

## 2017-06-27 ENCOUNTER — Telehealth: Payer: Self-pay | Admitting: Women's Health

## 2017-06-27 DIAGNOSIS — E039 Hypothyroidism, unspecified: Secondary | ICD-10-CM

## 2017-06-27 MED ORDER — LEVOTHYROXINE SODIUM 50 MCG PO TABS: 50.0000 ug | ORAL_TABLET | Freq: Every day | ORAL | 6 refills | Status: DC

## 2017-06-27 NOTE — Telephone Encounter (Signed)
Notified pt TSH now 0.068, taking synthroid 165mcg/day, was on 33mcg/day prior to pregnancy. She is currently 5wks pp. To decrease synthroid back to 73mcg/day, plan to recheck at next visit in 4wks. Also discussed I got a message [facility] had tried to contact her twice unsuccessfully, states she just hasn't had a chance to call them back yet, but plans to.   Roma Schanz, CNM, WHNP-BC 06/27/2017 1:18 PM

## 2017-06-27 NOTE — Addendum Note (Signed)
Addended by: Wells Guiles R on: 06/27/2017 01:21 PM   Modules accepted: Orders

## 2017-07-28 ENCOUNTER — Ambulatory Visit: Payer: BLUE CROSS/BLUE SHIELD | Admitting: Women's Health

## 2017-08-04 ENCOUNTER — Ambulatory Visit (INDEPENDENT_AMBULATORY_CARE_PROVIDER_SITE_OTHER): Payer: BLUE CROSS/BLUE SHIELD | Admitting: Women's Health

## 2017-08-04 ENCOUNTER — Encounter: Payer: Self-pay | Admitting: Women's Health

## 2017-08-04 VITALS — BP 110/74 | HR 73 | Ht 65.0 in | Wt 176.0 lb

## 2017-08-04 DIAGNOSIS — F418 Other specified anxiety disorders: Secondary | ICD-10-CM | POA: Diagnosis not present

## 2017-08-04 DIAGNOSIS — E039 Hypothyroidism, unspecified: Secondary | ICD-10-CM

## 2017-08-04 DIAGNOSIS — L659 Nonscarring hair loss, unspecified: Secondary | ICD-10-CM

## 2017-08-04 NOTE — Progress Notes (Signed)
   GYN VISIT Patient name: Erica Farley MRN [id number]  Date of birth: 1986/03/18 Chief Complaint & History of Present Illness:   Erica Farley is a 31 y.o. G33P2002 Caucasian female being seen today for Follow-up (on Prozac; check TSH) She is 2 mths s/p RLTCS w/ BTL. Was on prozac 40mg  daily during pregnancy and is still taking as directed. She was on alprazolam prior to pregnancy, not sure if she feels she needs to restart it right now. Feels she is doing better w/ the depression/anxiety. EPDS today 9, was 12 on 06/23/17. Denies SI/HI/II. Ordered referral to [facility] at pp visit 9/6, pt never returned their calls to schedule appt. Is bottlefeeding only now.  Also rechecking TSH today, has hypothyroidism, was on synthroid 151mcg during pregnancy and TSH was 0.068 at pp visit, so synthroid was decreased to pre-pregnancy dose of 10mcg daily.  Reports hair loss, no obvious patches.  Patient's last menstrual period was 07/20/2017. The current method of family planning is tubal ligation. Last pap 03/29/16. Results were:  normal Review of Systems:   Pertinent items are noted in HPI Denies fever/chills, dizziness, headaches, visual disturbances, fatigue, shortness of breath, chest pain, abdominal pain, vomiting, abnormal vaginal discharge/itching/odor/irritation, problems with periods, bowel movements, urination, or intercourse unless otherwise stated above.  Pertinent History Reviewed:  Reviewed past medical,surgical, social, obstetrical and family history.  Reviewed problem list, medications and allergies. Physical Assessment:   Vitals:   08/04/17 1020  BP: 110/74  Pulse: 73  Weight: 176 lb (79.8 kg)  Height: 5\' 5"  (1.651 m)  Body mass index is 29.29 kg/m.       Physical Examination:   General appearance: alert, well appearing, and in no distress  Mental status: alert, oriented to person, place, and time  Skin: warm & dry HAIR: hair exam is normal without alopecia or scalp  lesions  Cardiovascular: normal heart rate noted   Respiratory: normal respiratory effort, no distress   Abdomen: soft, non-tender   Pelvic: examination not indicated  Extremities: no edema   No results found for this or any previous visit (from the past 24 hour(s)).  Assessment & Plan:  1) 74mths s/p RLTCS w/ BTL> no longer breastfeeding, bottlefeeding only  2) Depression/anxiety> continue prozac 40mg  daily, let Erica Farley know if feels needs to resume alprazolam. Contact [facility] for appt for counseling if she feels she needs/wants to. Order was placed at pp visit.   3) Hypothyroidism> on synthroid 10mcg daily, recheck TSH today  4) Hair loss> discussed this can be normal during pp period, let Erica Farley know if starts seeing patches of hair missing, etc  Orders Placed This Encounter  Procedures  . TSH    Return for after 6/12 for , Physical.  Tawnya Crook CNM, [facility] 08/04/2017 10:54 AM

## 2017-08-05 LAB — TSH: TSH: 3.69 u[IU]/mL (ref 0.450–4.500)

## 2017-10-21 ENCOUNTER — Other Ambulatory Visit: Payer: Self-pay | Admitting: Adult Health

## 2018-03-03 ENCOUNTER — Ambulatory Visit: Payer: BLUE CROSS/BLUE SHIELD | Admitting: Adult Health

## 2018-03-15 ENCOUNTER — Encounter: Payer: Self-pay | Admitting: Adult Health

## 2018-03-15 ENCOUNTER — Ambulatory Visit (INDEPENDENT_AMBULATORY_CARE_PROVIDER_SITE_OTHER): Payer: BLUE CROSS/BLUE SHIELD | Admitting: Adult Health

## 2018-03-15 VITALS — BP 122/84 | HR 90 | Ht 65.0 in | Wt 169.0 lb

## 2018-03-15 DIAGNOSIS — E039 Hypothyroidism, unspecified: Secondary | ICD-10-CM | POA: Diagnosis not present

## 2018-03-15 DIAGNOSIS — F419 Anxiety disorder, unspecified: Secondary | ICD-10-CM

## 2018-03-15 DIAGNOSIS — R5383 Other fatigue: Secondary | ICD-10-CM | POA: Diagnosis not present

## 2018-03-15 DIAGNOSIS — R002 Palpitations: Secondary | ICD-10-CM

## 2018-03-15 MED ORDER — NORETHIN ACE-ETH ESTRAD-FE 1-20 MG-MCG(24) PO CAPS: 1.0000 | ORAL_CAPSULE | Freq: Every day | ORAL | 0 refills | Status: DC

## 2018-03-15 MED ORDER — DULOXETINE HCL 30 MG PO CPEP: 30.0000 mg | ORAL_CAPSULE | Freq: Every day | ORAL | 3 refills | Status: DC

## 2018-03-15 NOTE — Progress Notes (Signed)
  Subjective:     Patient ID: Erica Farley, female   DOB: 07/05/1986, 32 y.o.   MRN: [id number]  HPI Erica Farley is a 32 year old white female, married, G2P2, in wanting thyroid checked.Has had increase in anxiety and ?panic attack, heart palpitations and dry skin.She is feeling down around time for her period and tired.She has had tubal.   Review of Systems +increase in anxiety,?panic attack  +heart palpitations +dry skin Feels down around time for period and tired Reviewed past medical,surgical, social and family history. Reviewed medications and allergies.     Objective:   Physical Exam BP 122/84 (BP Location: Left Arm, Patient Position: Sitting, Cuff Size: Normal)   Pulse 90   Ht 5\' 5"  (1.651 m)   Wt 169 lb (76.7 kg)   LMP 02/10/2018 (Exact Date)   BMI 28.12 kg/m  Skin warm and dry. Neck: mid line trachea, normal thyroid, good ROM, no lymphadenopathy noted. Lungs: clear to ausculation bilaterally. Cardiovascular: regular rate and rhythm. Will check labs and stop Prozac and try Cymbalta,as it works for her mom and sister, and will try OCs to see if that helps.   Face time 15 minutes with 50% counseling.  Assessment:     1. Hypothyroidism, unspecified type   2. Anxiety   3. Heart palpitations   4. Feeling tired     Plan:     Stop Prozac and start Cymbalta Check TSH and free T4 and CBC Given 4 packs [place] to try 1 daily F/U in 3 months

## 2018-03-16 ENCOUNTER — Other Ambulatory Visit: Payer: Self-pay | Admitting: Women's Health

## 2018-03-16 LAB — CBC
Hematocrit: 43.5 % (ref 34.0–46.6)
Hemoglobin: 14.2 g/dL (ref 11.1–15.9)
MCH: 30.9 pg (ref 26.6–33.0)
MCHC: 32.6 g/dL (ref 31.5–35.7)
MCV: 95 fL (ref 79–97)
Platelets: 351 10*3/uL (ref 150–450)
RBC: 4.59 x10E6/uL (ref 3.77–5.28)
RDW: 14.8 % (ref 12.3–15.4)
WBC: 7.9 10*3/uL (ref 3.4–10.8)

## 2018-03-16 LAB — T4, FREE: Free T4: 1.1 ng/dL (ref 0.82–1.77)

## 2018-03-16 LAB — TSH: TSH: 7.25 u[IU]/mL — ABNORMAL HIGH (ref 0.450–4.500)

## 2018-03-16 MED ORDER — LEVOTHYROXINE SODIUM 75 MCG PO TABS: 75.0000 ug | ORAL_TABLET | Freq: Every day | ORAL | 6 refills | Status: DC

## 2018-06-15 ENCOUNTER — Encounter: Payer: Self-pay | Admitting: Adult Health

## 2018-06-15 ENCOUNTER — Ambulatory Visit (INDEPENDENT_AMBULATORY_CARE_PROVIDER_SITE_OTHER): Payer: BLUE CROSS/BLUE SHIELD | Admitting: Adult Health

## 2018-06-15 VITALS — BP 118/82 | HR 86 | Ht 65.0 in | Wt 161.0 lb

## 2018-06-15 DIAGNOSIS — E039 Hypothyroidism, unspecified: Secondary | ICD-10-CM

## 2018-06-15 DIAGNOSIS — R002 Palpitations: Secondary | ICD-10-CM | POA: Diagnosis not present

## 2018-06-15 DIAGNOSIS — F418 Other specified anxiety disorders: Secondary | ICD-10-CM | POA: Diagnosis not present

## 2018-06-15 MED ORDER — HYDROXYZINE HCL 10 MG PO TABS: 10.0000 mg | ORAL_TABLET | Freq: Three times a day (TID) | ORAL | 0 refills | Status: DC | PRN

## 2018-06-15 NOTE — Progress Notes (Signed)
  Subjective:     Patient ID: Erica Farley, female   DOB: [birth year], 32 y.o.   MRN: [id number]  HPI Erica Farley is a 32 year old whtie female, married, back in f/U on thyroid meds.still having occasional palpitation.Stopped Cymbalta and went back to Prozac, felt more depressed on Cymbalta. And never tried OCs.   Review of Systems +heart palpitations Reviewed past medical,surgical, social and family history. Reviewed medications and allergies.     Objective:   Physical Exam BP 118/82 (BP Location: Left Arm, Patient Position: Sitting, Cuff Size: Normal)   Pulse 86   Ht 5\' 5"  (1.651 m)   Wt 161 lb (73 kg)   BMI 26.79 kg/m  Skin warm and dry. Neck: mid line trachea, normal thyroid, good ROM, no lymphadenopathy noted. Lungs: clear to ausculation bilaterally. Cardiovascular: regular rate and rhythm. She has lost about 8 lbs. PHQ 2 score 0. Will add vistaril prn, if feels like panic attack.     Assessment:     1. Hypothyroidism, unspecified type   2. Heart palpitations   3. Depression with anxiety       Plan:     Check TSH and free T4 Continue Prozac, and synthroid, will talk when labs back  Meds ordered this encounter  Medications  . hydrOXYzine (ATARAX/VISTARIL) 10 MG tablet    Sig: Take 1 tablet (10 mg total) by mouth 3 (three) times daily as needed.    Dispense:  30 tablet    Refill:  0    Order Specific Question:   Supervising Provider    Answer:   Tania Ade H [[id number]]  F/U prn

## 2018-06-16 LAB — T4, FREE: Free T4: 1.24 ng/dL (ref 0.82–1.77)

## 2018-06-16 LAB — TSH: TSH: 2.52 u[IU]/mL (ref 0.450–4.500)

## 2018-10-21 ENCOUNTER — Other Ambulatory Visit: Payer: Self-pay | Admitting: Women's Health

## 2018-11-20 ENCOUNTER — Other Ambulatory Visit: Payer: Self-pay | Admitting: Adult Health

## 2019-01-10 ENCOUNTER — Other Ambulatory Visit: Payer: Self-pay | Admitting: Adult Health

## 2019-01-10 MED ORDER — LEVOTHYROXINE SODIUM 75 MCG PO TABS: ORAL_TABLET | ORAL | 2 refills | Status: DC

## 2019-01-10 NOTE — Progress Notes (Signed)
Refilled synthroid for 3 months supply

## 2019-01-11 ENCOUNTER — Ambulatory Visit: Payer: BLUE CROSS/BLUE SHIELD | Admitting: Psychology

## 2019-01-15 ENCOUNTER — Ambulatory Visit (INDEPENDENT_AMBULATORY_CARE_PROVIDER_SITE_OTHER): Payer: BLUE CROSS/BLUE SHIELD | Admitting: Psychology

## 2019-01-15 DIAGNOSIS — F411 Generalized anxiety disorder: Secondary | ICD-10-CM

## 2019-01-22 ENCOUNTER — Ambulatory Visit (INDEPENDENT_AMBULATORY_CARE_PROVIDER_SITE_OTHER): Payer: BLUE CROSS/BLUE SHIELD | Admitting: Psychology

## 2019-01-22 DIAGNOSIS — F411 Generalized anxiety disorder: Secondary | ICD-10-CM

## 2019-01-22 NOTE — Telephone Encounter (Signed)
Patient called back, stated she gave the wrong number in error.  The correct number is [phone].

## 2019-01-23 ENCOUNTER — Telehealth: Payer: Self-pay | Admitting: Adult Health

## 2019-01-23 MED ORDER — ALPRAZOLAM 0.25 MG PO TABS: ORAL_TABLET | ORAL | 0 refills | Status: DC

## 2019-01-23 NOTE — Telephone Encounter (Signed)
Pt called me the weekend, has had several episodes, of shortness of breath, hands and face feeling numb. Panic attack, Was to told to try breathing in brown bad, and relaxing, She says it helped, and she is seeing BT Royetta Crochet and using CALM app and is trying meditation now, is already on 40 mg of Prozac will add xanax .25 mg bid prn panic attack.Will check TSH this summer

## 2019-01-24 ENCOUNTER — Ambulatory Visit: Payer: BLUE CROSS/BLUE SHIELD | Admitting: Psychology

## 2019-02-01 ENCOUNTER — Telehealth: Payer: Self-pay | Admitting: Adult Health

## 2019-02-01 DIAGNOSIS — E039 Hypothyroidism, unspecified: Secondary | ICD-10-CM

## 2019-02-01 NOTE — Addendum Note (Signed)
Addended by: Derrek Monaco A on: 02/01/2019 12:37 PM   Modules accepted: Orders

## 2019-02-01 NOTE — Telephone Encounter (Signed)
Patient called stating that she would like a phone call from [place]. Pt states that she would like [name] to order some labs for her. Please contact pt

## 2019-02-01 NOTE — Telephone Encounter (Signed)
She wants thyroid checked today or tomorrow, will place order and she is feeling depressed, and is taking Prozac and xanax prn, number given for [facility].

## 2019-02-02 LAB — T4, FREE: Free T4: 1.16 ng/dL (ref 0.82–1.77)

## 2019-02-02 LAB — TSH: TSH: 2.55 u[IU]/mL (ref 0.450–4.500)

## 2019-02-06 ENCOUNTER — Other Ambulatory Visit: Payer: Self-pay

## 2019-02-06 ENCOUNTER — Encounter: Payer: Self-pay | Admitting: Family Medicine

## 2019-02-06 ENCOUNTER — Telehealth: Payer: Self-pay

## 2019-02-06 ENCOUNTER — Ambulatory Visit (INDEPENDENT_AMBULATORY_CARE_PROVIDER_SITE_OTHER): Payer: BLUE CROSS/BLUE SHIELD | Admitting: Family Medicine

## 2019-02-06 VITALS — BP 131/79 | HR 75

## 2019-02-06 DIAGNOSIS — E039 Hypothyroidism, unspecified: Secondary | ICD-10-CM | POA: Diagnosis not present

## 2019-02-06 DIAGNOSIS — F410 Panic disorder [episodic paroxysmal anxiety] without agoraphobia: Secondary | ICD-10-CM

## 2019-02-06 DIAGNOSIS — F411 Generalized anxiety disorder: Secondary | ICD-10-CM

## 2019-02-06 MED ORDER — ALPRAZOLAM 0.5 MG PO TABS: ORAL_TABLET | ORAL | 1 refills | Status: DC

## 2019-02-06 MED ORDER — FLUOXETINE HCL 20 MG PO TABS: ORAL_TABLET | ORAL | 0 refills | Status: DC

## 2019-02-06 NOTE — Telephone Encounter (Signed)
Please assist patient with getting appt scheduled.  Copied from [place] [phone]. Topic: Appointment Scheduling - Scheduling Inquiry for Clinic >> Feb 06, 2019 11:01 AM Vernona Rieger wrote: Reason for CRM: pt just had an establish visit with Dr Anitra Lauth. He would like to do another in one week to check on her medication. Attempted office a few times/ no answer. Please call her @ [phone]

## 2019-02-06 NOTE — Progress Notes (Signed)
Virtual Visit via Video Note  I connected with pt  on 02/07/19 at  9:40 AM EDT by a video enabled telemedicine application and verified that I am speaking with the correct person using two identifiers.  Location patient: home Location provider:work or home office Persons participating in the virtual visit: patient, provider  I discussed the limitations of evaluation and management by telemedicine and the availability of in person appointments. The patient expressed understanding and agreed to proceed.  Telemedicine visit is a necessity given the COVID-19 restrictions in place at the current time.  Patient Care Team    Relationship Specialty Notifications Start End  Estill Dooms, NP PCP - OBGYN Nurse Practitioner  11/07/12          Office Note 02/07/2019  CC:  Chief Complaint  Patient presents with  . Establish Care  . Anxiety  . Depression    HPI:  Erica Farley is a 33 y.o. White female who is being seen to establish care and discuss anxiety and depression problems. Patient's most recent primary MD: seeing her GYN office Old records in EPIC/HL EMR were reviewed prior to or during today's visit.  Describes hx of generalized anxiety "all my life". Near panic quite a bit: irritable, feeling like can't get deep breaths, feeling a bit shaky, wanting to withdraw. Has had about 1 full out panic attack each month lately.   Main problem is hyperventilating when she has these and having her head and hands go numb.  Has had 2 in the last 2 weeks.  No trigger--just "comes out of the blue".  Lasts 15-20 min total but peaks in about 4 min: sob, shaky, tight in chest, hyperventilates, dizzy, some presyncope feeling.  Med hx: Fluox x years, at least 13yrs of 40mg  qd dosing.  Denies any side effects from this med. Alpraz on and off, most recently rx'd by her GYN NP, filled 01/23/19, #30 of the 0.25 mg tabs (taking 2 per day avg). In the remote past she was put on cymbalta at the  suggestion of her mom and sister--she recalls taking the med only 1 week but felt depressed so she switched back fluox and felt better. Also was on lexapro in the past: she can't recall any specifics. She is seeing Dr. Marlowe Sax, psychologist for counseling x 3 visits and plans more.  Mood: usually has 1 day out of each month that she is extremely "blue" and she was fine all the other days. Since having more problem with panic/anxiety lately, also has a couple days of feeling very depressed. Some days very happy, but not really any manic behavior.   Past Medical History:  Diagnosis Date  . Anxiety   . Body aches 02/09/2016  . 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 Contraceptive management 07/23/2014  . Depression 02/09/2016  . Hypothyroidism 02/10/2016  . Menstrual headache 03/06/2015  . Neck pain, bilateral posterior 09/06/2014  . Pain in hand 03/06/2015    Past Surgical History:  Procedure Laterality Date  . bladder stem     . CESAREAN SECTION  11/16/2012   Procedure: CESAREAN SECTION;  Surgeon: Florian Buff, MD;  Location: [facility];  Service: Obstetrics;  Laterality: N/A;  . CESAREAN SECTION WITH BILATERAL TUBAL LIGATION Bilateral 05/18/2017   Procedure: CESAREAN SECTION WITH BILATERAL TUBAL LIGATION;  Surgeon: Florian Buff, MD;  Location: [place];  Service: Obstetrics;  Laterality: Bilateral;  . WISDOM TOOTH EXTRACTION      Family  History  Problem Relation Age of Onset  . Depression Maternal Grandmother   . Fibromyalgia Maternal Grandmother   . Cancer Paternal Grandfather        lung  . Fibromyalgia Mother   . COPD Maternal Grandfather   . Bipolar disorder Paternal Grandmother   . Diabetes Paternal Grandmother   . Suicidality Paternal Aunt     Social History   Socioeconomic History  . Marital status: Married    Spouse name: Not on file  . Number of children: Not on file  . Years of education: Not on file  . Highest education level: Not on file   Occupational History  . Not on file  Social Needs  . Financial resource strain: Not on file  . Food insecurity:    Worry: Not on file    Inability: Not on file  . Transportation needs:    Medical: Not on file    Non-medical: Not on file  Tobacco Use  . Smoking status: Never Smoker  . Smokeless tobacco: Never Used  Substance and Sexual Activity  . Alcohol use: No  . Drug use: No  . Sexual activity: Yes    Birth control/protection: Surgical    Comment: tubal  Lifestyle  . Physical activity:    Days per week: Not on file    Minutes per session: Not on file  . Stress: Not on file  Relationships  . Social connections:    Talks on phone: Not on file    Gets together: Not on file    Attends religious service: Not on file    Active member of club or organization: Not on file    Attends meetings of clubs or organizations: Not on file    Relationship status: Not on file  . Intimate partner violence:    Fear of current or ex partner: Not on file    Emotionally abused: Not on file    Physically abused: Not on file    Forced sexual activity: Not on file  Other Topics Concern  . Not on file  Social History Narrative   Married, 2 kids.   Educ: [employer].   Some college at [employer]   Home maker.   No T/A/Ds.    Outpatient Encounter Medications as of 02/06/2019  Medication Sig  . FLUoxetine (PROZAC) 40 MG capsule TAKE 1 CAPSULE BY MOUTH ONCE DAILY  . levothyroxine (SYNTHROID, LEVOTHROID) 75 MCG tablet TAKE 1 TABLET BY MOUTH DAILY BEFORE BREAKFAST  . pseudoephedrine-acetaminophen (TYLENOL SINUS) 30-500 MG TABS tablet Take 2 tablets by mouth 2 (two) times daily as needed.  . [DISCONTINUED] ALPRAZolam (XANAX) 0.25 MG tablet Take 1 bid prn for panic attack  . ALPRAZolam (XANAX) 0.5 MG tablet 1 tab po bid prn anxiety  . FLUoxetine (PROZAC) 20 MG tablet 1 tab po q evening with supper  . [DISCONTINUED] hydrOXYzine (ATARAX/VISTARIL) 10 MG tablet Take 1 tablet (10 mg total) by mouth 3  (three) times daily as needed. (Patient not taking: Reported on 02/06/2019)  . [DISCONTINUED] Norethin Ace-Eth Estrad-FE (TAYTULLA) 1-20 MG-MCG(24) CAPS Take 1 tablet by mouth daily. (Patient not taking: Reported on 06/15/2018)   No facility-administered encounter medications on file as of 02/06/2019.     No Known Allergies  ROS Review of Systems  Constitutional: Negative for appetite change, chills, fatigue and fever.  HENT: Negative for congestion, dental problem, ear pain and sore throat.   Eyes: Negative for discharge, redness and visual disturbance.  Respiratory: Negative for cough, chest tightness,  shortness of breath and wheezing.   Cardiovascular: Negative for chest pain, palpitations and leg swelling.  Gastrointestinal: Negative for abdominal pain, blood in stool, diarrhea, nausea and vomiting.  Genitourinary: Negative for difficulty urinating, dysuria, flank pain, frequency, hematuria and urgency.  Musculoskeletal: Negative for arthralgias, back pain, joint swelling, myalgias and neck stiffness.  Skin: Negative for pallor and rash.  Neurological: Negative for dizziness, speech difficulty, weakness and headaches.  Hematological: Negative for adenopathy. Does not bruise/bleed easily.  Psychiatric/Behavioral: Positive for sleep disturbance. Negative for confusion. The patient is nervous/anxious.     PE; Blood pressure 131/79, pulse 75, last menstrual period 02/02/2019, not currently breastfeeding. GENERAL: alert, oriented, appears well and in no acute distress AFFECT: pleasant, lucid thought and speech.  HEENT: atraumatic, conjunttiva clear, no obvious abnormalities on inspection of external nose and ears  NECK: normal movements of the head and neck  LUNGS: on inspection no signs of respiratory distress, breathing rate appears normal, no obvious gross SOB, gasping or wheezing  CV: no obvious cyanosis  MS: moves all visible extremities without noticeable  abnormality  PSYCH/NEURO: pleasant and cooperative, no obvious depression or anxiety, speech and thought processing grossly intact  Pertinent labs:  Lab Results  Component Value Date   TSH 2.550 02/01/2019   Lab Results  Component Value Date   WBC 7.9 03/15/2018   HGB 14.2 03/15/2018   HCT 43.5 03/15/2018   MCV 95 03/15/2018   PLT 351 03/15/2018   Lab Results  Component Value Date   CREATININE 0.74 05/05/2017   BUN 8 05/05/2017   NA 136 05/05/2017   K 4.7 05/05/2017   CL 101 05/05/2017   CO2 20 05/05/2017   Lab Results  Component Value Date   ALT 7 05/05/2017   AST 13 05/05/2017   ALKPHOS 119 (H) 05/05/2017   BILITOT 0.5 05/05/2017    ASSESSMENT AND PLAN:   New pt:  1) GAD with panic disorder. Increase fluoxetine to 40mg  qAM and 20 mg q evening.  Discussed with pt the fact that this is more than conventional dosing and risk of side effects/serotonin syndrome are higher but I think the risk is small since she has been on 40mg  qd for a long time AND currently feels no side effects from it at all.  Pt elected to proceed.  Continue alpraz bid prn, increase pill strength to 0.5mg  1 bid prn, #60, rF x 1. If pt stays on this med with regularity then we'll get her to come in and review and sign CSC. Continue counseling that she has already been doing recently.  2) Hypothyroidism: most recent TSH done earlier this month at GYN was great->2.5. Continue current synthroid dosing.   I discussed the assessment and treatment plan with the patient. The patient was provided an opportunity to ask questions and all were answered. The patient agreed with the plan and demonstrated an understanding of the instructions.   The patient was advised to call back or seek an in-person evaluation if the symptoms worsen or if the condition fails to improve as anticipated.  F/u: 1 wk video visit.  Signed:  Crissie Sickles, MD           02/07/2019

## 2019-02-06 NOTE — Telephone Encounter (Signed)
Called patient scheduled appt 02/15/19

## 2019-02-08 ENCOUNTER — Ambulatory Visit (INDEPENDENT_AMBULATORY_CARE_PROVIDER_SITE_OTHER): Payer: BLUE CROSS/BLUE SHIELD | Admitting: Psychology

## 2019-02-08 DIAGNOSIS — F411 Generalized anxiety disorder: Secondary | ICD-10-CM | POA: Diagnosis not present

## 2019-02-12 ENCOUNTER — Ambulatory Visit (INDEPENDENT_AMBULATORY_CARE_PROVIDER_SITE_OTHER): Payer: BLUE CROSS/BLUE SHIELD | Admitting: Psychology

## 2019-02-12 DIAGNOSIS — F411 Generalized anxiety disorder: Secondary | ICD-10-CM

## 2019-02-15 ENCOUNTER — Ambulatory Visit (INDEPENDENT_AMBULATORY_CARE_PROVIDER_SITE_OTHER): Payer: BLUE CROSS/BLUE SHIELD | Admitting: Family Medicine

## 2019-02-15 ENCOUNTER — Encounter: Payer: Self-pay | Admitting: Family Medicine

## 2019-02-15 ENCOUNTER — Other Ambulatory Visit: Payer: Self-pay

## 2019-02-15 VITALS — BP 108/65 | HR 93

## 2019-02-15 DIAGNOSIS — F410 Panic disorder [episodic paroxysmal anxiety] without agoraphobia: Secondary | ICD-10-CM

## 2019-02-15 DIAGNOSIS — F411 Generalized anxiety disorder: Secondary | ICD-10-CM

## 2019-02-15 DIAGNOSIS — M797 Fibromyalgia: Secondary | ICD-10-CM

## 2019-02-15 NOTE — Progress Notes (Signed)
Virtual Visit via Video Note  I connected with Erica Farley on 02/15/19 at  9:40 AM EDT by a video enabled telemedicine application and verified that I am speaking with the correct person using two identifiers.  Location patient: home Location provider:work or home office Persons participating in the virtual visit: patient, provider  I discussed the limitations of evaluation and management by telemedicine and the availability of in person appointments. The patient expressed understanding and agreed to proceed.  Telemedicine visit is a necessity given the COVID-19 restrictions in place at the current time.  HPI: 33 y/o WF being seen today for 1 week f/u GAD with panic disorder. Last week was my initial visit with her, and we increased her fluoxetine to 40mg  qAM and 20mg  qPM. I also increased the strength of her alprazolam to take bid prn.  No CSC in place as of yet-->waiting to see if this is going to be a chronic med for Erica Farley or not.  Interim Hx: No adverse side effects on increased dosing of fluoxetine. Taking the alpraz 1 tab bid and this helps her feel "leveled out" better.  New problem: Describes strong FH of fibromyalgia. Erica Farley reports having chronic neck and shoulders pain since her early 76s. Tenderness in mid/upper back region some, also some sensitivity in R upper thigh and upper chest wall area (hurts some when my husband hugs me). No joint swelling or redness.  + on and off fatigue.   ROS: See pertinent positives and negatives per HPI.  Past Medical History:  Diagnosis Date  . Anxiety   . Body aches 02/09/2016  . 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 Contraceptive management 07/23/2014  . Depression 02/09/2016  . Hypothyroidism 02/10/2016  . Menstrual headache 03/06/2015  . Neck pain, bilateral posterior 09/06/2014  . Pain in hand 03/06/2015    Past Surgical History:  Procedure Laterality Date  . bladder stem     . CESAREAN SECTION  11/16/2012   Procedure: CESAREAN  SECTION;  Surgeon: Florian Buff, MD;  Location: [facility];  Service: Obstetrics;  Laterality: N/A;  . CESAREAN SECTION WITH BILATERAL TUBAL LIGATION Bilateral 05/18/2017   Procedure: CESAREAN SECTION WITH BILATERAL TUBAL LIGATION;  Surgeon: Florian Buff, MD;  Location: [facility];  Service: Obstetrics;  Laterality: Bilateral;  . WISDOM TOOTH EXTRACTION      Family History  Problem Relation Age of Onset  . Depression Maternal Grandmother   . Fibromyalgia Maternal Grandmother   . Cancer Paternal Grandfather        lung  . Fibromyalgia Mother   . COPD Maternal Grandfather   . Bipolar disorder Paternal Grandmother   . Diabetes Paternal Grandmother   . Suicidality Paternal Aunt     Current Outpatient Medications:  .  ALPRAZolam (XANAX) 0.5 MG tablet, 1 tab po bid prn anxiety, Disp: 60 tablet, Rfl: 1 .  FLUoxetine (PROZAC) 20 MG tablet, 1 tab po q evening with supper, Disp: 30 tablet, Rfl: 0 .  FLUoxetine (PROZAC) 40 MG capsule, TAKE 1 CAPSULE BY MOUTH ONCE DAILY (Patient taking differently: every morning. ), Disp: 90 capsule, Rfl: 2 .  levothyroxine (SYNTHROID, LEVOTHROID) 75 MCG tablet, TAKE 1 TABLET BY MOUTH DAILY BEFORE BREAKFAST, Disp: 90 tablet, Rfl: 2 .  pseudoephedrine-acetaminophen (TYLENOL SINUS) 30-500 MG TABS tablet, Take 2 tablets by mouth 2 (two) times daily as needed., Disp: , Rfl:   EXAM:  VITALS per patient if applicable: BP 009/38 (BP Location: Left Arm, Patient Position: Sitting, Cuff  Size: Normal)   Pulse 93   LMP 02/02/2019 (Exact Date)    GENERAL: alert, oriented, appears well and in no acute distress  HEENT: atraumatic, conjunttiva clear, no obvious abnormalities on inspection of external nose and ears  NECK: normal movements of the head and neck  LUNGS: on inspection no signs of respiratory distress, breathing rate appears normal, no obvious gross SOB, gasping or wheezing  CV: no obvious cyanosis  MS: moves all visible extremities without noticeable  abnormality  PSYCH/NEURO: pleasant and cooperative, no obvious depression or anxiety, speech and thought processing grossly intact  LABS: none today  Lab Results  Component Value Date   TSH 2.550 02/01/2019     Chemistry      Component Value Date/Time   NA 136 05/05/2017 1024   K 4.7 05/05/2017 1024   CL 101 05/05/2017 1024   CO2 20 05/05/2017 1024   BUN 8 05/05/2017 1024   CREATININE 0.74 05/05/2017 1024      Component Value Date/Time   CALCIUM 9.4 05/05/2017 1024   ALKPHOS 119 (H) 05/05/2017 1024   AST 13 05/05/2017 1024   ALT 7 05/05/2017 1024   BILITOT 0.5 05/05/2017 1024      ASSESSMENT AND PLAN:  Discussed the following assessment and plan:  1) GAD with panic d/o: improved. Tolerating fluoxetine at dosing of 40mg  qAM and 20mg  qPM for the last 1 wk. Continue this dosing and xanax bid prn.  Time will tell if she'll be requiring the xanax on a long term scheduled basis and we'll get CSC in chart as appropriate at future f/u visit.  2) Myalgias: suspect fibromyalgia.  Discussed possible treatment with gabapentin starting at the next f/u if tolerating fluox at current dosing still at that time. Lyrica would be next choice (she got depressed mood from duloxetine in the past so we'll avoid the antidepressants often used for fibromyalgia).   I discussed the assessment and treatment plan with the patient. The patient was provided an opportunity to ask questions and all were answered. The patient agreed with the plan and demonstrated an understanding of the instructions.   The patient was advised to call back or seek an in-person evaluation if the symptoms worsen or if the condition fails to improve as anticipated.  F/u: 6 wks.  Signed:  Crissie Sickles, MD           02/15/2019

## 2019-02-16 ENCOUNTER — Encounter: Payer: Self-pay | Admitting: Family Medicine

## 2019-02-17 ENCOUNTER — Encounter: Payer: Self-pay | Admitting: Family Medicine

## 2019-02-19 ENCOUNTER — Encounter: Payer: Self-pay | Admitting: Family Medicine

## 2019-02-21 ENCOUNTER — Ambulatory Visit: Payer: BLUE CROSS/BLUE SHIELD | Admitting: Psychology

## 2019-02-28 ENCOUNTER — Ambulatory Visit: Payer: BLUE CROSS/BLUE SHIELD | Admitting: Psychology

## 2019-03-02 ENCOUNTER — Other Ambulatory Visit: Payer: Self-pay | Admitting: Family Medicine

## 2019-03-21 ENCOUNTER — Encounter: Payer: Self-pay | Admitting: Family Medicine

## 2019-03-21 ENCOUNTER — Ambulatory Visit: Payer: BLUE CROSS/BLUE SHIELD | Admitting: Family Medicine

## 2019-03-21 ENCOUNTER — Ambulatory Visit (INDEPENDENT_AMBULATORY_CARE_PROVIDER_SITE_OTHER): Payer: BC Managed Care – PPO | Admitting: Family Medicine

## 2019-03-21 ENCOUNTER — Other Ambulatory Visit: Payer: Self-pay

## 2019-03-21 VITALS — Temp 97.6°F | Wt 165.0 lb

## 2019-03-21 DIAGNOSIS — M797 Fibromyalgia: Secondary | ICD-10-CM

## 2019-03-21 DIAGNOSIS — F411 Generalized anxiety disorder: Secondary | ICD-10-CM

## 2019-03-21 MED ORDER — GABAPENTIN 100 MG PO CAPS: 100.0000 mg | ORAL_CAPSULE | Freq: Three times a day (TID) | ORAL | 0 refills | Status: DC

## 2019-03-21 MED ORDER — FLUOXETINE HCL 20 MG PO TABS: ORAL_TABLET | ORAL | 3 refills | Status: DC

## 2019-03-21 MED ORDER — ALPRAZOLAM 1 MG PO TABS: 1.0000 mg | ORAL_TABLET | Freq: Two times a day (BID) | ORAL | 1 refills | Status: DC | PRN

## 2019-03-21 MED ORDER — FLUOXETINE HCL 40 MG PO CAPS: ORAL_CAPSULE | ORAL | 3 refills | Status: DC

## 2019-03-21 NOTE — Progress Notes (Signed)
Virtual Visit via Video Note  I connected with pt on 03/21/19 at 10:20 AM EDT by a video enabled telemedicine application and verified that I am speaking with the correct person using two identifiers.  Location patient: home Location provider:work or home office Persons participating in the virtual visit: patient, provider  I discussed the limitations of evaluation and management by telemedicine and the availability of in person appointments. The patient expressed understanding and agreed to proceed.  Telemedicine visit is a necessity given the COVID-19 restrictions in place at the current time.  HPI: 33 y/o WF being seen today for 6 wk f/u GAD and fibromyalgia.  Meds helping overall, no panic attacks. She still feels pretty anxious all the time but is functioning better. Taking 1mg  alpraz bid and feeling like this is signif better for her than 0.5mg  bid, and not oversedated at all. Sleep is consistently better. Is currently still in counseling with [employer], goes 1 time per month.  Fibromyalgia: she is still noticing trigger points in neck, shoulders, thighs and is interested in trial of gabapentin. She denies joint swelling or redness. She takes aleve prn severe sx's.   ROS: See pertinent positives and negatives per HPI.  Past Medical History:  Diagnosis Date  . 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 Contraceptive management 07/23/2014  . Fibromyalgia    neck, bilat shoulders, thighs, upper chest wall, traps  . GAD (generalized anxiety disorder)    with panic  . Hypothyroidism 02/10/2016  . Menstrual headache 03/06/2015    Past Surgical History:  Procedure Laterality Date  . bladder stem     . CESAREAN SECTION  11/16/2012   Procedure: CESAREAN SECTION;  Surgeon: Florian Buff, MD;  Location: [facility];  Service: Obstetrics;  Laterality: N/A;  . CESAREAN SECTION WITH BILATERAL TUBAL LIGATION Bilateral 05/18/2017   Procedure: CESAREAN SECTION WITH BILATERAL TUBAL  LIGATION;  Surgeon: Florian Buff, MD;  Location: [facility];  Service: Obstetrics;  Laterality: Bilateral;  . WISDOM TOOTH EXTRACTION      Family History  Problem Relation Age of Onset  . Depression Maternal Grandmother   . Fibromyalgia Maternal Grandmother   . Cancer Paternal Grandfather        lung  . Fibromyalgia Mother   . COPD Maternal Grandfather   . Bipolar disorder Paternal Grandmother   . Diabetes Paternal Grandmother   . 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   Current Outpatient Medications:  .  ALPRAZolam (XANAX) 0.5 MG tablet, 1 tab po bid prn anxiety, Disp: 60 tablet, Rfl: 1 .  FLUoxetine (PROZAC) 20 MG tablet, TAKE 1 TABLET BY MOUTH IN THE EVENING WITH SUPPER, Disp: 15 tablet, Rfl: 0 .  FLUoxetine (PROZAC) 40 MG capsule, TAKE 1 CAPSULE BY MOUTH ONCE DAILY (Patient taking differently: every morning. ), Disp: 90 capsule, Rfl: 2 .  levothyroxine (SYNTHROID, LEVOTHROID) 75 MCG tablet, TAKE 1 TABLET BY MOUTH DAILY BEFORE BREAKFAST, Disp: 90 tablet, Rfl: 2  EXAM:  VITALS per patient if applicable: Temp 40.9 F (36.4 C) (Oral)   Wt 165 lb (74.8 kg)   BMI 27.46 kg/m    GENERAL: alert, oriented, appears well and in no acute distress  HEENT: atraumatic, conjunttiva clear, no obvious abnormalities on inspection of external nose and ears  NECK: normal movements of the head and neck  LUNGS: on inspection no signs of respiratory distress, breathing rate appears normal, no obvious gross  SOB, gasping or wheezing  CV: no obvious cyanosis  MS: moves all visible extremities without noticeable abnormality  PSYCH/NEURO: pleasant and cooperative, no obvious depression or anxiety, speech and thought processing grossly intact  LABS: none today    Chemistry      Component Value Date/Time   NA 136 05/05/2017 1024   K 4.7 05/05/2017 1024   CL 101 05/05/2017 1024   CO2 20 05/05/2017 1024   BUN 8 05/05/2017 1024    CREATININE 0.74 05/05/2017 1024      Component Value Date/Time   CALCIUM 9.4 05/05/2017 1024   ALKPHOS 119 (H) 05/05/2017 1024   AST 13 05/05/2017 1024   ALT 7 05/05/2017 1024   BILITOT 0.5 05/05/2017 1024     Lab Results  Component Value Date   TSH 2.550 02/01/2019    ASSESSMENT AND PLAN:  Discussed the following assessment and plan:  1) GAD: improving. Continue fluox 40 qam and 20 qpm. Continue alpraz at the 1mg  bid dosing. New alpraz rx today.  2) Fibromyalgia: start trial of gabapentin 100mg : 1 qhs x 5d, then 1 bid x 5d, then 1 tid and stay on this dosing until f/u in 1 mo.  I discussed the assessment and treatment plan with the patient. The patient was provided an opportunity to ask questions and all were answered. The patient agreed with the plan and demonstrated an understanding of the instructions.   The patient was advised to call back or seek an in-person evaluation if the symptoms worsen or if the condition fails to improve as anticipated.  F/u: 1 mo f/u fibromyalgia and sign CSC and get UDS  Signed:  Crissie Sickles, MD           03/21/2019

## 2019-03-27 ENCOUNTER — Encounter: Payer: Self-pay | Admitting: Family Medicine

## 2019-04-09 ENCOUNTER — Encounter: Payer: Self-pay | Admitting: Family Medicine

## 2019-04-11 ENCOUNTER — Encounter: Payer: Self-pay | Admitting: Family Medicine

## 2019-04-12 ENCOUNTER — Ambulatory Visit (INDEPENDENT_AMBULATORY_CARE_PROVIDER_SITE_OTHER): Payer: BC Managed Care – PPO | Admitting: Family Medicine

## 2019-04-12 ENCOUNTER — Other Ambulatory Visit: Payer: Self-pay

## 2019-04-12 ENCOUNTER — Encounter: Payer: Self-pay | Admitting: Family Medicine

## 2019-04-12 VITALS — BP 119/80 | HR 80 | Temp 98.8°F | Resp 14 | Ht 64.0 in | Wt 175.8 lb

## 2019-04-12 DIAGNOSIS — M797 Fibromyalgia: Secondary | ICD-10-CM

## 2019-04-12 MED ORDER — GABAPENTIN 100 MG PO CAPS: ORAL_CAPSULE | ORAL | 0 refills | Status: DC

## 2019-04-12 NOTE — Progress Notes (Signed)
OFFICE VISIT  04/12/2019   CC:  Chief Complaint  Patient presents with  . head pain radiating down to both shoulders   HPI:    Patient is a 33 y.o. Caucasian female who presents accompanied by her mother today for recent worsening of neck, shoulders, and back of head pain. We are treating her for fibromyalgia, started with low dose gabapentin 3 wks ago, with plans to get to 100 mg tid by the time of f/u in 1 mo. Has tried tylenol and ibuprofen.  She took some of her mom's tramadol and has done this over the past few years secretly.  She divulged this to her mother yesterday, who had already suspected it.  She is remorseful and does not want to start any prescription pain med.  Past Medical History:  Diagnosis Date  . 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 Contraceptive management 07/23/2014  . Fibromyalgia    neck, bilat shoulders, thighs, upper chest wall, traps  . GAD (generalized anxiety disorder)    with panic  . Hypothyroidism 02/10/2016  . Menstrual headache 03/06/2015    Past Surgical History:  Procedure Laterality Date  . bladder stem     . CESAREAN SECTION  11/16/2012   Procedure: CESAREAN SECTION;  Surgeon: Florian Buff, MD;  Location: [facility];  Service: Obstetrics;  Laterality: N/A;  . CESAREAN SECTION WITH BILATERAL TUBAL LIGATION Bilateral 05/18/2017   Procedure: CESAREAN SECTION WITH BILATERAL TUBAL LIGATION;  Surgeon: Florian Buff, MD;  Location: [facility];  Service: Obstetrics;  Laterality: Bilateral;  . WISDOM TOOTH EXTRACTION      Outpatient Medications Prior to Visit  Medication Sig Dispense Refill  . ALPRAZolam (XANAX) 1 MG tablet Take 1 tablet (1 mg total) by mouth 2 (two) times daily as needed for anxiety. 180 tablet 1  . FLUoxetine (PROZAC) 20 MG tablet TAKE 1 TABLET BY MOUTH IN THE EVENING WITH SUPPER 90 tablet 3  . FLUoxetine (PROZAC) 40 MG capsule 1 cap po qAM 90 capsule 3  . levothyroxine (SYNTHROID, LEVOTHROID) 75 MCG tablet TAKE 1  TABLET BY MOUTH DAILY BEFORE BREAKFAST 90 tablet 2  . gabapentin (NEURONTIN) 100 MG capsule Take 1 capsule (100 mg total) by mouth 3 (three) times daily. 90 capsule 0   No facility-administered medications prior to visit.     No Known Allergies  ROS As per HPI  PE: Blood pressure 119/80, pulse 80, temperature 98.8 F (37.1 C), temperature source Temporal, resp. rate 14, height 5\' 4"  (1.626 m), weight 175 lb 12.8 oz (79.7 kg), SpO2 100 %, not currently breastfeeding. Gen: Alert, well appearing.  Patient is oriented to person, place, time, and situation. Affect: pleasant but tearful at times.   LABS:    Chemistry      Component Value Date/Time   NA 136 05/05/2017 1024   K 4.7 05/05/2017 1024   CL 101 05/05/2017 1024   CO2 20 05/05/2017 1024   BUN 8 05/05/2017 1024   CREATININE 0.74 05/05/2017 1024      Component Value Date/Time   CALCIUM 9.4 05/05/2017 1024   ALKPHOS 119 (H) 05/05/2017 1024   AST 13 05/05/2017 1024   ALT 7 05/05/2017 1024   BILITOT 0.5 05/05/2017 1024       IMPRESSION AND PLAN:  Fibromyalgia: not doing well.   We agreed no narcotic pain med (or tramadol) to be prescribed for her, esp since these are not indicated for fibromyalgia pain. We discussed uptitration of  her gabapentin to 300 mg tid and if she is not improved any on the 300 mg tid dosing after 1 full week of that dose then we'll try a different medication. Pt in agreement, mother supportive. Controlled substance contract (for her xanax) reviewed with patient today.  Patient signed this and it will be placed in the chart.    An After Visit Summary was printed and given to the patient.  FOLLOW UP: Return in about 2 weeks (around 04/26/2019) for routine chronic illness f/u.  Signed:  Crissie Sickles, MD           04/12/2019

## 2019-04-16 ENCOUNTER — Encounter: Payer: Self-pay | Admitting: Family Medicine

## 2019-04-19 ENCOUNTER — Ambulatory Visit: Payer: BC Managed Care – PPO | Admitting: Family Medicine

## 2019-04-19 ENCOUNTER — Other Ambulatory Visit: Payer: Self-pay

## 2019-04-19 ENCOUNTER — Encounter: Payer: Self-pay | Admitting: Family Medicine

## 2019-04-19 ENCOUNTER — Ambulatory Visit (INDEPENDENT_AMBULATORY_CARE_PROVIDER_SITE_OTHER): Payer: BC Managed Care – PPO | Admitting: Family Medicine

## 2019-04-19 DIAGNOSIS — F411 Generalized anxiety disorder: Secondary | ICD-10-CM | POA: Diagnosis not present

## 2019-04-19 DIAGNOSIS — M797 Fibromyalgia: Secondary | ICD-10-CM | POA: Diagnosis not present

## 2019-04-19 MED ORDER — GABAPENTIN 300 MG PO CAPS: 300.0000 mg | ORAL_CAPSULE | Freq: Three times a day (TID) | ORAL | 1 refills | Status: DC

## 2019-04-19 NOTE — Progress Notes (Signed)
Virtual Visit via Video Note  I connected with pt on 04/19/19 at 10:30 AM EDT by a video enabled telemedicine application and verified that I am speaking with the correct person using two identifiers.  Location patient: home Location provider:work or home office Persons participating in the virtual visit: patient, provider  I discussed the limitations of evaluation and management by telemedicine and the availability of in person appointments. The patient expressed understanding and agreed to proceed.   HPI: 33 y/o WF with fibromyalgia and GAD who presents for f/u, at which time she informed me that she took some of her mom's tramadol and has done this over the past few years secretly.  She divulged this to her mother yesterday, who had already suspected it.  She is remorseful and does not want to start any prescription pain med.  A/P as of last visit 04/12/19: "We agreed no narcotic pain med (or tramadol) to be prescribed for her, esp since these are not indicated for fibromyalgia pain. We discussed uptitration of her gabapentin to 300 mg tid and if she is not improved any on the 300 mg tid dosing after 1 full week of that dose then we'll try a different medication. Pt in agreement, mother supportive. Controlled substance contract (for her xanax) reviewed with patient today."  Interim hx: Her 10 yr wedding [employer] was last w/e.  Had a good time. Next few days felt good, energy good, did yard work, next day felt somewhat painful and tired.  Better today. More anxious today: no specific thing.  She is trying some breathing exercises.  She has a book about "self talk" that she is going to start.  Mood is fine.   Appetite is fine.  Cleaning/organizing seems to help so she's considering this today.  She is going out for "girls night" tonight and is looking forward to it (at the [employer] in).  She is now on the 300mg  tid gabapentin dosing for the last few days.    PMP AWARE reviewed today  and most recent rx for alprazolam was filled 03/21/19, I was the prescriber, #180 tabs (90 d supply).  No suspicious activity.  ROS: See pertinent positives and negatives per HPI.  Past Medical History:  Diagnosis Date  . 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 Contraceptive management 07/23/2014  . Fibromyalgia    neck, bilat shoulders, thighs, upper chest wall, traps  . GAD (generalized anxiety disorder)    with panic  . Hypothyroidism 02/10/2016  . Menstrual headache 03/06/2015    Past Surgical History:  Procedure Laterality Date  . bladder stem     . CESAREAN SECTION  11/16/2012   Procedure: CESAREAN SECTION;  Surgeon: Florian Buff, MD;  Location: [facility];  Service: Obstetrics;  Laterality: N/A;  . CESAREAN SECTION WITH BILATERAL TUBAL LIGATION Bilateral 05/18/2017   Procedure: CESAREAN SECTION WITH BILATERAL TUBAL LIGATION;  Surgeon: Florian Buff, MD;  Location: [facility];  Service: Obstetrics;  Laterality: Bilateral;  . WISDOM TOOTH EXTRACTION      Family History  Problem Relation Age of Onset  . Depression Maternal Grandmother   . Fibromyalgia Maternal Grandmother   . Cancer Paternal Grandfather        lung  . Fibromyalgia Mother   . COPD Maternal Grandfather   . Bipolar disorder Paternal Grandmother   . Diabetes Paternal Grandmother   . Suicidality Paternal Aunt      Current Outpatient Medications:  .  ALPRAZolam Duanne Moron)  1 MG tablet, Take 1 tablet (1 mg total) by mouth 2 (two) times daily as needed for anxiety., Disp: 180 tablet, Rfl: 1 .  FLUoxetine (PROZAC) 20 MG tablet, TAKE 1 TABLET BY MOUTH IN THE EVENING WITH SUPPER, Disp: 90 tablet, Rfl: 3 .  FLUoxetine (PROZAC) 40 MG capsule, 1 cap po qAM, Disp: 90 capsule, Rfl: 3 .  gabapentin (NEURONTIN) 100 MG capsule, 3 tabs po tid, Disp: 30 capsule, Rfl: 0 .  levothyroxine (SYNTHROID, LEVOTHROID) 75 MCG tablet, TAKE 1 TABLET BY MOUTH DAILY BEFORE BREAKFAST, Disp: 90 tablet, Rfl: 2  EXAM:  VITALS per  patient if applicable: There were no vitals taken for this visit.   GENERAL: alert, oriented, appears well and in no acute distress  HEENT: atraumatic, conjunttiva clear, no obvious abnormalities on inspection of external nose and ears  NECK: normal movements of the head and neck  LUNGS: on inspection no signs of respiratory distress, breathing rate appears normal, no obvious gross SOB, gasping or wheezing  CV: no obvious cyanosis  MS: moves all visible extremities without noticeable abnormality  PSYCH/NEURO: pleasant and cooperative, no obvious depression or anxiety, speech and thought processing grossly intact  LABS: none today  Lab Results  Component Value Date   TSH 2.550 02/01/2019    ASSESSMENT AND PLAN:  Discussed the following assessment and plan:  1) GAD with panic: stable.  Having some good days and less bad days.  We reviewed anxiety coping techniques again today.  She will continue fluoxetine 40mg  qAM and 20mg  qPM as well as alprazolam 1 mg bid prn.  2) Fibromyalgia: stable/still bothering her fairly frequently.  She'll stay on gabapentin 300 mg tid for another 2 wks (has been on this dosing only a few days now) and reassess. Rx'd gabapentin 300mg  tabs, 1 tid, #90, RF x 1.  I discussed the assessment and treatment plan with the patient. The patient was provided an opportunity to ask questions and all were answered. The patient agreed with the plan and demonstrated an understanding of the instructions.   The patient was advised to call back or seek an in-person evaluation if the symptoms worsen or if the condition fails to improve as anticipated.   F/u: 2 wks  Signed:  Crissie Sickles, MD           04/19/2019

## 2019-05-16 ENCOUNTER — Encounter: Payer: Self-pay | Admitting: Family Medicine

## 2019-05-16 ENCOUNTER — Ambulatory Visit (INDEPENDENT_AMBULATORY_CARE_PROVIDER_SITE_OTHER): Payer: BC Managed Care – PPO | Admitting: Family Medicine

## 2019-05-16 ENCOUNTER — Other Ambulatory Visit: Payer: Self-pay

## 2019-05-16 VITALS — BP 106/74 | HR 83 | Temp 97.5°F | Ht 64.0 in | Wt 174.0 lb

## 2019-05-16 DIAGNOSIS — F411 Generalized anxiety disorder: Secondary | ICD-10-CM | POA: Diagnosis not present

## 2019-05-16 DIAGNOSIS — M797 Fibromyalgia: Secondary | ICD-10-CM | POA: Diagnosis not present

## 2019-05-16 DIAGNOSIS — E663 Overweight: Secondary | ICD-10-CM

## 2019-05-16 MED ORDER — PREGABALIN 50 MG PO CAPS: 50.0000 mg | ORAL_CAPSULE | Freq: Three times a day (TID) | ORAL | 0 refills | Status: DC

## 2019-05-16 NOTE — Progress Notes (Signed)
Virtual Visit via Video Note  I connected with pt on 05/16/19 at 11:30 AM EDT by a video enabled telemedicine application and verified that I am speaking with the correct person using two identifiers.  Location patient: home Location provider:work or home office Persons participating in the virtual visit: patient, provider  I discussed the limitations of evaluation and management by telemedicine and the availability of in person appointments. The patient expressed understanding and agreed to proceed.  Telemedicine visit is a necessity given the COVID-19 restrictions in place at the current time.  HPI: 33 y/o WF being seen today for 1 mo f/u fibromyalgia syndrome.  Fibromyalgia: last visit she had just started gabapentin at the 300 mg tid dosing so we kept her on this to give it more time.  Has been on this dosing.  Has noted her pain has been worse the last month, primarily with more frequent headaches.  Some more focal muscle/soft tissue pain--trigger points--in upper back and other areas more sensitive.   Mood: no persistent depressed mood.  Anxiety: still about the same, some good days some bad days.  ROS: no CP, no SOB, no wheezing, no cough, no dizziness, no HAs, no rashes, no melena/hematochezia.  No polyuria or polydipsia.  No joint swelling.  Past Medical History:  Diagnosis Date  . 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 Contraceptive management 07/23/2014  . Fibromyalgia    neck, bilat shoulders, thighs, upper chest wall, traps  . GAD (generalized anxiety disorder)    with panic  . Hypothyroidism 02/10/2016  . Menstrual headache 03/06/2015    Past Surgical History:  Procedure Laterality Date  . bladder stem     . CESAREAN SECTION  11/16/2012   Procedure: CESAREAN SECTION;  Surgeon: Florian Buff, MD;  Location: [facility];  Service: Obstetrics;  Laterality: N/A;  . CESAREAN SECTION WITH BILATERAL TUBAL LIGATION Bilateral 05/18/2017   Procedure: CESAREAN SECTION WITH  BILATERAL TUBAL LIGATION;  Surgeon: Florian Buff, MD;  Location: [facility];  Service: Obstetrics;  Laterality: Bilateral;  . WISDOM TOOTH EXTRACTION      Family History  Problem Relation Age of Onset  . Depression Maternal Grandmother   . Fibromyalgia Maternal Grandmother   . Cancer Paternal Grandfather        lung  . Fibromyalgia Mother   . COPD Maternal Grandfather   . Bipolar disorder Paternal Grandmother   . Diabetes Paternal Grandmother   . Suicidality Paternal Aunt     SOCIAL HX:  Social History   Socioeconomic History  . Marital status: Married    Spouse name: Not on file  . Number of children: Not on file  . Years of education: Not on file  . Highest education level: Not on file  Occupational History  . Not on file  Social Needs  . Financial resource strain: Not on file  . Food insecurity    Worry: Not on file    Inability: Not on file  . Transportation needs    Medical: Not on file    Non-medical: Not on file  Tobacco Use  . Smoking status: Never Smoker  . Smokeless tobacco: Never Used  Substance and Sexual Activity  . Alcohol use: No  . Drug use: No  . Sexual activity: Yes    Birth control/protection: Surgical    Comment: tubal  Lifestyle  . Physical activity    Days per week: Not on file    Minutes per session: Not on  file  . Stress: Not on file  Relationships  . Social Herbalist on phone: Not on file    Gets together: Not on file    Attends religious service: Not on file    Active member of club or organization: Not on file    Attends meetings of clubs or organizations: Not on file    Relationship status: Not on file  Other Topics Concern  . Not on file  Social History Narrative   Married, 2 kids.   Educ: [employer].   Some college at [employer].   Home maker.   No T/A/Ds.      Current Outpatient Medications:  .  ALPRAZolam (XANAX) 1 MG tablet, Take 1 tablet (1 mg total) by mouth 2 (two) times daily as needed for  anxiety., Disp: 180 tablet, Rfl: 1 .  FLUoxetine (PROZAC) 20 MG tablet, TAKE 1 TABLET BY MOUTH IN THE EVENING WITH SUPPER, Disp: 90 tablet, Rfl: 3 .  FLUoxetine (PROZAC) 40 MG capsule, 1 cap po qAM, Disp: 90 capsule, Rfl: 3 .  gabapentin (NEURONTIN) 300 MG capsule, Take 1 capsule (300 mg total) by mouth 3 (three) times daily., Disp: 90 capsule, Rfl: 1 .  levothyroxine (SYNTHROID, LEVOTHROID) 75 MCG tablet, TAKE 1 TABLET BY MOUTH DAILY BEFORE BREAKFAST, Disp: 90 tablet, Rfl: 2  EXAM:  VITALS per patient if applicable: BP 026/37   Pulse 83   Temp (!) 97.5 F (36.4 C) (Temporal)   Ht 5\' 4"  (1.626 m)   Wt 174 lb (78.9 kg)   BMI 29.87 kg/m    GENERAL: alert, oriented, appears well and in no acute distress  HEENT: atraumatic, conjunttiva clear, no obvious abnormalities on inspection of external nose and ears  NECK: normal movements of the head and neck  LUNGS: on inspection no signs of respiratory distress, breathing rate appears normal, no obvious gross SOB, gasping or wheezing  CV: no obvious cyanosis  MS: moves all visible extremities without noticeable abnormality  PSYCH/NEURO: pleasant and cooperative, no obvious depression or anxiety, speech and thought processing grossly intact  LABS: none today  Lab Results  Component Value Date   TSH 2.550 02/01/2019      Chemistry      Component Value Date/Time   NA 136 05/05/2017 1024   K 4.7 05/05/2017 1024   CL 101 05/05/2017 1024   CO2 20 05/05/2017 1024   BUN 8 05/05/2017 1024   CREATININE 0.74 05/05/2017 1024      Component Value Date/Time   CALCIUM 9.4 05/05/2017 1024   ALKPHOS 119 (H) 05/05/2017 1024   AST 13 05/05/2017 1024   ALT 7 05/05/2017 1024   BILITOT 0.5 05/05/2017 1024     Lab Results  Component Value Date   WBC 7.9 03/15/2018   HGB 14.2 03/15/2018   HCT 43.5 03/15/2018   MCV 95 03/15/2018   PLT 351 03/15/2018   ASSESSMENT AND PLAN:  Discussed the following assessment and plan:  1) Fibromyalgia  syndrome: failed gabapentin.  Ween of this med over the next 7-10d.   Poor candidate for tricyclic or cymbalta b/c already on 60mg  daily dose of fluoxetine. Will start lyrica trial at 50 mg bid. If tolerating this well at f/u in 1 mo then plan on titrating dose up slowly.  2) GAD, with panic: no significant changes. Continue current meds.  I discussed the assessment and treatment plan with the patient. The patient was provided an opportunity to ask questions and all were  answered. The patient agreed with the plan and demonstrated an understanding of the instructions.   The patient was advised to call back or seek an in-person evaluation if the symptoms worsen or if the condition fails to improve as anticipated.  F/u: 4 wks  Signed:  Crissie Sickles, MD           05/16/2019

## 2019-05-27 ENCOUNTER — Encounter: Payer: Self-pay | Admitting: Family Medicine

## 2019-06-04 ENCOUNTER — Encounter: Payer: Self-pay | Admitting: Family Medicine

## 2019-06-05 ENCOUNTER — Telehealth: Payer: Self-pay | Admitting: Family Medicine

## 2019-06-05 MED ORDER — PREGABALIN 75 MG PO CAPS: 75.0000 mg | ORAL_CAPSULE | Freq: Three times a day (TID) | ORAL | 0 refills | Status: DC

## 2019-06-05 NOTE — Telephone Encounter (Signed)
RF request for Lyrica 50mg  LOV: 05/16/19 Next ov: 06/06/19 Last written: 05/16/19 #60  Patient is requesting a refill on her medication. I saw in the notes she is supposed to take it BID but she is taking it TID according to the medications tab and a message from the patient. I know she comes in tomorrow, she is stating that she is concerned because she is going to miss 2 doses. Do you want me to see if we can move her appointment earlier tomorrow? Please let me know. Thank you!

## 2019-06-05 NOTE — Telephone Encounter (Signed)
OK, I just sent in a rx for lyrica 75mg  caps, take 1 three times per day.

## 2019-06-05 NOTE — Telephone Encounter (Signed)
Called patient and she stated that the 50mg  is working for her but she would be interested in increasing her dosage. She feels that it would help her. Please advise, thank you

## 2019-06-05 NOTE — Telephone Encounter (Signed)
Pt called stating she is concerned because she is missing 2 doses of this medication. She is requesting to know if this is okay from PCP. Please advise.

## 2019-06-05 NOTE — Telephone Encounter (Signed)
Pls see if she is feeling significant improvement on the 50mg  tid dosing. If not, is she having any significant side effects from it? Let me know and I will make sure I send in new rx so she doesn't have to go w/out any while waiting for her f/u appt.-thx

## 2019-06-05 NOTE — Telephone Encounter (Signed)
Called patient and she is aware of new script sent to her pharmacy

## 2019-06-05 NOTE — Addendum Note (Signed)
Addended by: Tammi Sou on: 06/05/2019 11:56 AM   Modules accepted: Orders

## 2019-06-06 ENCOUNTER — Ambulatory Visit (INDEPENDENT_AMBULATORY_CARE_PROVIDER_SITE_OTHER): Payer: BC Managed Care – PPO | Admitting: Family Medicine

## 2019-06-06 ENCOUNTER — Other Ambulatory Visit: Payer: Self-pay

## 2019-06-06 ENCOUNTER — Encounter: Payer: Self-pay | Admitting: Family Medicine

## 2019-06-06 VITALS — BP 127/70 | HR 85 | Temp 98.8°F | Resp 17 | Ht 64.0 in | Wt 176.0 lb

## 2019-06-06 DIAGNOSIS — F411 Generalized anxiety disorder: Secondary | ICD-10-CM | POA: Diagnosis not present

## 2019-06-06 DIAGNOSIS — M797 Fibromyalgia: Secondary | ICD-10-CM | POA: Diagnosis not present

## 2019-06-06 NOTE — Progress Notes (Signed)
OFFICE VISIT  06/06/2019   CC:  Chief Complaint  Patient presents with  . Follow-up    started taking increased dose of lyrica this morning. no concerns with medication. 1 out of 10 on pain scale. pain in right and left shoulder   HPI:    Patient is a 33 y.o. Caucasian female who presents for 3 week f/u fibromyalgia syndrome. Started lyrica last visit b/c failed neurontin trial. Pt is improved about 75%!  I just increased dose to 75mg  tid yesterday. Mood improved, pain much improved, less anxious and not needing alpraz as much. When she does hurt lately it is at the base of her occiput and in sides of neck and tops of shoulders.   Past Medical History:  Diagnosis Date  . 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 Contraceptive management 07/23/2014  . Fibromyalgia    neck, bilat shoulders, thighs, upper chest wall, traps  . GAD (generalized anxiety disorder)    with panic  . Hypothyroidism 02/10/2016  . Menstrual headache 03/06/2015    Past Surgical History:  Procedure Laterality Date  . bladder stem     . CESAREAN SECTION  11/16/2012   Procedure: CESAREAN SECTION;  Surgeon: Florian Buff, MD;  Location: [facility];  Service: Obstetrics;  Laterality: N/A;  . CESAREAN SECTION WITH BILATERAL TUBAL LIGATION Bilateral 05/18/2017   Procedure: CESAREAN SECTION WITH BILATERAL TUBAL LIGATION;  Surgeon: Florian Buff, MD;  Location: [facility];  Service: Obstetrics;  Laterality: Bilateral;  . WISDOM TOOTH EXTRACTION      Outpatient Medications Prior to Visit  Medication Sig Dispense Refill  . ALPRAZolam (XANAX) 1 MG tablet Take 1 tablet (1 mg total) by mouth 2 (two) times daily as needed for anxiety. 180 tablet 1  . FLUoxetine (PROZAC) 20 MG tablet TAKE 1 TABLET BY MOUTH IN THE EVENING WITH SUPPER 90 tablet 3  . FLUoxetine (PROZAC) 40 MG capsule 1 cap po qAM 90 capsule 3  . levothyroxine (SYNTHROID, LEVOTHROID) 75 MCG tablet TAKE 1 TABLET BY MOUTH DAILY BEFORE BREAKFAST 90  tablet 2  . pregabalin (LYRICA) 75 MG capsule Take 1 capsule (75 mg total) by mouth 3 (three) times daily. 90 capsule 0   No facility-administered medications prior to visit.     No Known Allergies  ROS As per HPI  PE: Blood pressure 127/70, pulse 85, temperature 98.8 F (37.1 C), temperature source Temporal, resp. rate 17, height 5\' 4"  (1.626 m), weight 176 lb (79.8 kg), last menstrual period 05/07/2019, SpO2 98 %. Gen: Alert, well appearing.  Patient is oriented to person, place, time, and situation. AFFECT: pleasant, lucid thought and speech. No further exam today.  LABS:    Chemistry      Component Value Date/Time   NA 136 05/05/2017 1024   K 4.7 05/05/2017 1024   CL 101 05/05/2017 1024   CO2 20 05/05/2017 1024   BUN 8 05/05/2017 1024   CREATININE 0.74 05/05/2017 1024      Component Value Date/Time   CALCIUM 9.4 05/05/2017 1024   ALKPHOS 119 (H) 05/05/2017 1024   AST 13 05/05/2017 1024   ALT 7 05/05/2017 1024   BILITOT 0.5 05/05/2017 1024     Lab Results  Component Value Date   TSH 2.550 02/01/2019     IMPRESSION AND PLAN:  1) Fibromyalgia: MUCH improved the last few weeks since getting on lyrica! Just increased dosing to 75mg  tid yesterday. Call in a couple weeks if she feels  like pain is not continuing to improve and we'll push the dose to 100mg  tid (or 150mg  bid). Mood and anxiety also better.  No change in fluoxetine or alprazolam at this time.  An After Visit Summary was printed and given to the patient.  FOLLOW UP: Return in about 2 months (around 08/06/2019) for f/u fibromyalgia and GAD.  Signed:  Crissie Sickles, MD           06/06/2019

## 2019-06-17 ENCOUNTER — Encounter: Payer: Self-pay | Admitting: Family Medicine

## 2019-06-18 MED ORDER — FLUOXETINE HCL 20 MG PO CAPS: 20.0000 mg | ORAL_CAPSULE | Freq: Every day | ORAL | 2 refills | Status: DC

## 2019-06-18 NOTE — Telephone Encounter (Signed)
Prozac 20mg  capsules was sent to pharmacy. RX for tablets was cancelled. Pt notified via my chart.

## 2019-06-20 ENCOUNTER — Encounter: Payer: Self-pay | Admitting: Family Medicine

## 2019-06-20 MED ORDER — PREGABALIN 150 MG PO CAPS: 150.0000 mg | ORAL_CAPSULE | Freq: Two times a day (BID) | ORAL | 0 refills | Status: DC

## 2019-06-20 NOTE — Telephone Encounter (Signed)
OK, I rx'd #90 of the lyrica 75mg  tabs a little less than 2 wks ago, so she should have approx 45 left.  Tell her to start taking TWO of these 75mg  tabs TWICE per day. I'll send new rx for 150mg  tabs to her pharmacy that she can then fill when she runs out of her 75mg  tabs.

## 2019-06-21 ENCOUNTER — Ambulatory Visit: Payer: BC Managed Care – PPO | Admitting: Family Medicine

## 2019-06-22 MED ORDER — PREGABALIN 150 MG PO CAPS: 150.0000 mg | ORAL_CAPSULE | Freq: Two times a day (BID) | ORAL | 0 refills | Status: DC

## 2019-06-22 NOTE — Telephone Encounter (Signed)
Patient called to say that she will be out of meds [facility]) when she is out of town on vaction.  Asking to be filled now before she leaves.  Please contact patient at [phone]  Thank you

## 2019-06-22 NOTE — Telephone Encounter (Signed)
OK. I had already sent the 150mg  tabs in with instructions to "fill on or after 07/02/19". I just sent a new one in with instructions "may fill now" on it.  Hopefully there won't be any confusion at the pharmacy.

## 2019-07-24 ENCOUNTER — Telehealth: Payer: Self-pay | Admitting: Family Medicine

## 2019-07-24 NOTE — Telephone Encounter (Signed)
Spoke with patient and she admitted she has been having an issue taking the alprazolam. She needs assistance with mental health, substance abuse. unable to let me know if she wanted to see psychologist or psychiatrist. She is also concerned about her current dosage of Lyrica, 150mg  and unable to concentrate. She believes she may need to go back down to 75mg .  Please advise, thanks.

## 2019-07-24 NOTE — Telephone Encounter (Signed)
Tell her the best way to start with addressing this problem is to talk to [place] first. Appointment needed (either virtual or in -office is fine)-thx

## 2019-07-24 NOTE — Telephone Encounter (Signed)
Pt had upcoming f/u appt with you on 10/23. Does she need to try and be worked in sooner or okay to keep as is?  Please advise, thanks.

## 2019-07-24 NOTE — Telephone Encounter (Signed)
Patient requesting referral to therapist and talk about meds   Please call

## 2019-07-25 ENCOUNTER — Encounter: Payer: Self-pay | Admitting: Family Medicine

## 2019-07-25 NOTE — Telephone Encounter (Signed)
Work in sooner at one of my 4 oclock slots on a day where I don't already have 2 at that spot.-thx

## 2019-07-25 NOTE — Telephone Encounter (Signed)
Patient was contacted, advised of PCP recommendations. Pt's appt has been moved up to 10/12 to further discuss w/ PCP.

## 2019-07-30 ENCOUNTER — Other Ambulatory Visit: Payer: Self-pay

## 2019-07-30 ENCOUNTER — Ambulatory Visit (INDEPENDENT_AMBULATORY_CARE_PROVIDER_SITE_OTHER): Payer: BC Managed Care – PPO | Admitting: Family Medicine

## 2019-07-30 ENCOUNTER — Encounter: Payer: Self-pay | Admitting: Family Medicine

## 2019-07-30 ENCOUNTER — Telehealth: Payer: Self-pay | Admitting: Family Medicine

## 2019-07-30 VITALS — BP 107/78 | HR 70 | Temp 98.7°F | Resp 16 | Ht 64.0 in | Wt 184.0 lb

## 2019-07-30 DIAGNOSIS — M797 Fibromyalgia: Secondary | ICD-10-CM

## 2019-07-30 DIAGNOSIS — F1310 Sedative, hypnotic or anxiolytic abuse, uncomplicated: Secondary | ICD-10-CM | POA: Diagnosis not present

## 2019-07-30 DIAGNOSIS — Z23 Encounter for immunization: Secondary | ICD-10-CM

## 2019-07-30 DIAGNOSIS — F411 Generalized anxiety disorder: Secondary | ICD-10-CM | POA: Diagnosis not present

## 2019-07-30 MED ORDER — PREGABALIN 75 MG PO CAPS: ORAL_CAPSULE | ORAL | 0 refills | Status: DC

## 2019-07-30 NOTE — Telephone Encounter (Signed)
Pls call pt and give her information on a counselor that was recommended. Beckey Downing on [address]. There are several counselors at her practice and hopefully one can see her soon.  She is located on [address] in [place]. [phone]. No referral is necessary-->even if I make a referral nothing happens b/c they always want the PATIENT to initiate the contact with them. She can call us back if this is not helpful.-thx

## 2019-07-30 NOTE — Progress Notes (Signed)
OFFICE VISIT  07/30/2019   CC:  Chief Complaint  Patient presents with  . Anxiety    having some issues with xanax. she has stopped medication. she would like a therapist. she stopped because she was miss using it.   . Fibromyalgia    no concerns. lyrica is helping. still has tightness in shoulders.   HPI:    Patient is a 33 y.o. Caucasian female who presents accompanied by her husband for concerns regarding her medications for anxiety and fibromyalgia.  Anxiety: has been on fluoxetine long term.  I added xanax not long after I became her pcp. About 2 wks ago she realized she was mis-using xanax, would take up to 3 or 4 at a time. She says she and family recognized she has addictive potential and wanted to deal with it. She has not had any alpraz in 2 wks, says she has none left. Has put her husband in charge of her meds now. She admits to "problems" similar to this "every couple of years" but could not really be specific today, was a bit emotional and nervous/embarrassed.  She basically wants to go to a substance abuse counselor.  She went to [facility] in [place] a few days ago and found the experience off-putting. Requests private practitioner.  Fibromyalgia: she stopped her lyrica, she abruptly took herself off lyrica only 3d and felt overwhelmed anxiety -wise and pain/fibromyalgia symptoms.  She then restarted the med at 150mg  bid and feels much improved.  However, she is wondering if she should get off of this med, too.   Past Medical History:  Diagnosis Date  . 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 Contraceptive management 07/23/2014  . Fibromyalgia    neck, bilat shoulders, thighs, upper chest wall, traps  . GAD (generalized anxiety disorder)    with panic  . Hypothyroidism 02/10/2016  . Menstrual headache 03/06/2015    Past Surgical History:  Procedure Laterality Date  . bladder stem     . CESAREAN SECTION  11/16/2012   Procedure: CESAREAN SECTION;   Surgeon: Florian Buff, MD;  Location: [facility];  Service: Obstetrics;  Laterality: N/A;  . CESAREAN SECTION WITH BILATERAL TUBAL LIGATION Bilateral 05/18/2017   Procedure: CESAREAN SECTION WITH BILATERAL TUBAL LIGATION;  Surgeon: Florian Buff, MD;  Location: [facility];  Service: Obstetrics;  Laterality: Bilateral;  . WISDOM TOOTH EXTRACTION      Outpatient Medications Prior to Visit  Medication Sig Dispense Refill  . FLUoxetine (PROZAC) 20 MG capsule Take 1 capsule (20 mg total) by mouth daily. TAKE 1 TABLET BY MOUTH IN THE EVENING WITH SUPPER 90 capsule 2  . FLUoxetine (PROZAC) 40 MG capsule 1 cap po qAM 90 capsule 3  . levothyroxine (SYNTHROID, LEVOTHROID) 75 MCG tablet TAKE 1 TABLET BY MOUTH DAILY BEFORE BREAKFAST 90 tablet 2  . pregabalin (LYRICA) 150 MG capsule Take 1 capsule (150 mg total) by mouth 2 (two) times daily. 60 capsule 0  . ALPRAZolam (XANAX) 1 MG tablet Take 1 tablet (1 mg total) by mouth 2 (two) times daily as needed for anxiety. (Patient not taking: Reported on 07/30/2019) 180 tablet 1   No facility-administered medications prior to visit.     No Known Allergies  ROS As per HPI  PE: Blood pressure 107/78, pulse 70, temperature 98.7 F (37.1 C), temperature source Temporal, resp. rate 16, height 5\' 4"  (1.626 m), weight 184 lb (83.5 kg), last menstrual period 06/09/2019, SpO2 100 %. Gen:  Alert, well appearing.  Patient is oriented to person, place, time, and situation. AFFECT: pleasant, lucid thought and speech. No further exam today.  LABS:    Chemistry      Component Value Date/Time   NA 136 05/05/2017 1024   K 4.7 05/05/2017 1024   CL 101 05/05/2017 1024   CO2 20 05/05/2017 1024   BUN 8 05/05/2017 1024   CREATININE 0.74 05/05/2017 1024      Component Value Date/Time   CALCIUM 9.4 05/05/2017 1024   ALKPHOS 119 (H) 05/05/2017 1024   AST 13 05/05/2017 1024   ALT 7 05/05/2017 1024   BILITOT 0.5 05/05/2017 1024      IMPRESSION AND PLAN:  1)  Alprazolam abuse; she has discontinued use. I am in the process of getting her info on a substance abuse counselor who is a Psychologist, educational (for outpatient counseling).  She went to [facility] in [place] and prefers to try a Mult[employer] [facility]. [facility] [place] and they do not do outpt counseling for individuals who have not already been in their inpatient treatment program. They recommended trying [facility] or [facility], both of which are in [place]. Will give pt contact info so she can initiate the process.  2) Fibromyalgia: was responding well to lyrica but I recommended she ween off this med b/c it does also have some addiction/abuse potential.  TAke 75mg  bid x 15d, then 75mg  qd x 15d, then stop. As stated above, her husband is now in charge of her meds to make sure she follows recommendations.  Spent 25 min with pt today, with >50% of this time spent in counseling and care coordination regarding the above problems.  An After Visit Summary was printed and given to the patient.  FOLLOW UP: Return in about 4 weeks (around 08/27/2019) for telemed or in-office, either one is fine with me0-->f/u fibromyalgia.  Signed:  Crissie Sickles, MD           07/30/2019

## 2019-07-31 NOTE — Telephone Encounter (Signed)
Patient was contacted, not able to write down information at the time. MyChart message sent with all the referral information.

## 2019-07-31 NOTE — Telephone Encounter (Signed)
MyChart message read.

## 2019-08-10 ENCOUNTER — Ambulatory Visit: Payer: BC Managed Care – PPO | Admitting: Family Medicine

## 2019-08-27 ENCOUNTER — Ambulatory Visit: Payer: BC Managed Care – PPO | Admitting: Family Medicine

## 2019-08-31 ENCOUNTER — Telehealth: Payer: Self-pay | Admitting: Family Medicine

## 2019-08-31 NOTE — Telephone Encounter (Signed)
Called patient back to schedule virtual visit. Patient made an appointment to go to [facility] in [place] to get the test done there. Advised patient Dr. Anitra Lauth will not see the results.

## 2019-08-31 NOTE — Telephone Encounter (Signed)
Patient's father is COVID positive. Patient has a low grade fever, is congested. Please advise.

## 2019-08-31 NOTE — Telephone Encounter (Signed)
Okay'd by PCP for 9:45 slot. Please assist with scheduling.

## 2019-09-21 ENCOUNTER — Ambulatory Visit: Payer: BC Managed Care – PPO | Admitting: Family Medicine

## 2019-10-26 ENCOUNTER — Ambulatory Visit: Payer: BC Managed Care – PPO | Admitting: Family Medicine

## 2019-11-22 ENCOUNTER — Telehealth: Payer: Self-pay | Admitting: *Deleted

## 2019-11-22 DIAGNOSIS — E039 Hypothyroidism, unspecified: Secondary | ICD-10-CM

## 2019-11-22 NOTE — Telephone Encounter (Signed)
Pt would like Jenn to order thyroid labs.

## 2019-11-23 NOTE — Telephone Encounter (Signed)
Periods irregular and wants thyroid checked, orders in

## 2019-11-23 NOTE — Addendum Note (Signed)
Addended by: Derrek Monaco A on: 11/23/2019 02:03 PM   Modules accepted: Orders

## 2019-11-28 DIAGNOSIS — E039 Hypothyroidism, unspecified: Secondary | ICD-10-CM | POA: Diagnosis not present

## 2019-11-29 LAB — T4, FREE: Free T4: 1.13 ng/dL (ref 0.82–1.77)

## 2019-11-29 LAB — TSH: TSH: 2.42 u[IU]/mL (ref 0.450–4.500)

## 2020-01-19 ENCOUNTER — Other Ambulatory Visit: Payer: Self-pay | Admitting: Women's Health

## 2020-01-22 ENCOUNTER — Telehealth: Payer: Self-pay | Admitting: Adult Health

## 2020-01-22 MED ORDER — LEVOTHYROXINE SODIUM 75 MCG PO TABS: ORAL_TABLET | ORAL | 2 refills | Status: DC

## 2020-01-22 NOTE — Addendum Note (Signed)
Addended by: Derrek Monaco A on: 01/22/2020 04:48 PM   Modules accepted: Orders

## 2020-01-22 NOTE — Telephone Encounter (Signed)
refill levothyroxine

## 2020-01-22 NOTE — Telephone Encounter (Signed)
Pt states she is needing her levothyroxine Refilled. Please send to [facility] in [place].

## 2020-04-14 ENCOUNTER — Other Ambulatory Visit: Payer: BC Managed Care – PPO

## 2020-04-14 ENCOUNTER — Encounter: Payer: Self-pay | Admitting: Adult Health

## 2020-04-14 DIAGNOSIS — Z8481 Family history of carrier of genetic disease: Secondary | ICD-10-CM | POA: Diagnosis not present

## 2020-04-30 ENCOUNTER — Telehealth: Payer: Self-pay | Admitting: *Deleted

## 2020-04-30 ENCOUNTER — Encounter: Payer: Self-pay | Admitting: *Deleted

## 2020-04-30 ENCOUNTER — Telehealth: Payer: Self-pay | Admitting: Adult Health

## 2020-04-30 DIAGNOSIS — E039 Hypothyroidism, unspecified: Secondary | ICD-10-CM

## 2020-04-30 DIAGNOSIS — D126 Benign neoplasm of colon, unspecified: Secondary | ICD-10-CM

## 2020-04-30 HISTORY — DX: Benign neoplasm of colon, unspecified: D12.6

## 2020-04-30 NOTE — Telephone Encounter (Signed)
Encounter closed. JSY 

## 2020-04-30 NOTE — Telephone Encounter (Signed)
Patient called stating that she would like for [facility] to place an order to check her thyroid. Please contact pt when order has been placed

## 2020-04-30 NOTE — Telephone Encounter (Signed)
Pt aware that order placed for TSH and free T4 Pt also aware that + for MUTYH variant, number given for genetic counselor Doroteo Glassman [phone]

## 2020-04-30 NOTE — Addendum Note (Signed)
Addended by: Derrek Monaco A on: 04/30/2020 01:28 PM   Modules accepted: Orders

## 2020-05-01 DIAGNOSIS — E039 Hypothyroidism, unspecified: Secondary | ICD-10-CM | POA: Diagnosis not present

## 2020-05-02 ENCOUNTER — Telehealth: Payer: Self-pay | Admitting: Adult Health

## 2020-05-02 LAB — T4, FREE: Free T4: 1.03 ng/dL (ref 0.82–1.77)

## 2020-05-02 LAB — TSH: TSH: 7.68 u[IU]/mL — ABNORMAL HIGH (ref 0.450–4.500)

## 2020-05-02 MED ORDER — LEVOTHYROXINE SODIUM 100 MCG PO TABS
100.0000 ug | ORAL_TABLET | Freq: Every day | ORAL | 2 refills | Status: DC
Start: 2020-05-02 — End: 2020-07-15

## 2020-05-02 NOTE — Telephone Encounter (Signed)
Pt aware that TSH elevated will increase levothyroxine to 100 mcg and recheck in about 8 weeks

## 2020-05-26 ENCOUNTER — Other Ambulatory Visit: Payer: BC Managed Care – PPO | Admitting: Adult Health

## 2020-07-10 ENCOUNTER — Other Ambulatory Visit: Payer: Self-pay | Admitting: Family Medicine

## 2020-07-15 ENCOUNTER — Telehealth (INDEPENDENT_AMBULATORY_CARE_PROVIDER_SITE_OTHER): Payer: BC Managed Care – PPO | Admitting: Family Medicine

## 2020-07-15 ENCOUNTER — Encounter: Payer: Self-pay | Admitting: Family Medicine

## 2020-07-15 VITALS — Temp 97.4°F

## 2020-07-15 DIAGNOSIS — R059 Cough, unspecified: Secondary | ICD-10-CM

## 2020-07-15 DIAGNOSIS — R05 Cough: Secondary | ICD-10-CM

## 2020-07-15 DIAGNOSIS — R0981 Nasal congestion: Secondary | ICD-10-CM | POA: Diagnosis not present

## 2020-07-15 NOTE — Progress Notes (Signed)
Virtual Visit via Video Note  I connected with Jariyah  on 07/15/20 at  4:20 PM EDT by a video enabled telemedicine application and verified that I am speaking with the correct person using two identifiers.  Location patient: home Location provider:work or home office Persons participating in the virtual visit: patient, provider  I discussed the limitations of evaluation and management by telemedicine and the availability of in person appointments. The patient expressed understanding and agreed to proceed.   HPI:  Acute telemedicine visit for : -Onset: 3 days ago -Symptoms include: nasal congestion, dry cough, feels more tired than usual, body aches, some loss of smell -Denies: SOB, CP, NVD, fever, loss of taste -Pertinent medication allergies:nkda -COVID-19 vaccine status: had prior COVID19 infection in November, had J and J vaccine  In April    ROS: See pertinent positives and negatives per HPI.  Past Medical History:  Diagnosis Date  . 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 Contraceptive management 07/23/2014  . Fibromyalgia    neck, bilat shoulders, thighs, upper chest wall, traps  . GAD (generalized anxiety disorder)    with panic  . Hypothyroidism 02/10/2016  . Menstrual headache 03/06/2015  . 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     Past Surgical History:  Procedure Laterality Date  . bladder stem     . CESAREAN SECTION  11/16/2012   Procedure: CESAREAN SECTION;  Surgeon: Florian Buff, MD;  Location: [facility];  Service: Obstetrics;  Laterality: N/A;  . CESAREAN SECTION WITH BILATERAL TUBAL LIGATION Bilateral 05/18/2017   Procedure: CESAREAN SECTION WITH BILATERAL TUBAL LIGATION;  Surgeon: Florian Buff, MD;  Location: [facility];  Service: Obstetrics;  Laterality: Bilateral;  . WISDOM TOOTH  EXTRACTION       Current Outpatient Medications:  .  FLUoxetine (PROZAC) 40 MG capsule, Take 1 capsule by mouth in the morning, Disp: 30 capsule, Rfl: 0 .  levothyroxine (SYNTHROID) 75 MCG tablet, Take 75 mcg by mouth daily before breakfast., Disp: , Rfl:   EXAM:  VITALS per patient if applicable: See HPI  GENERAL: alert, oriented, appears well and in no acute distress  HEENT: atraumatic, conjunttiva clear, no obvious abnormalities on inspection of external nose and ears  NECK: normal movements of the head and neck  LUNGS: on inspection no signs of respiratory distress, breathing rate appears normal, no obvious gross SOB, gasping or wheezing  CV: no obvious cyanosis  MS: moves all visible extremities without noticeable abnormality  PSYCH/NEURO: pleasant and cooperative, no obvious depression or anxiety, speech and thought processing grossly intact  ASSESSMENT AND PLAN:  Discussed the following assessment and plan:  Nasal congestion  Cough  -we discussed possible serious and likely etiologies, options for evaluation and workup, limitations of telemedicine visit vs in person visit, treatment, treatment risks and precautions. Pt prefers to treat via telemedicine empirically rather than in person at this moment.  Discussed possible viral upper respiratory infection, influenza versus other.  She is fully vaccinated for COVID-19 and has had a prior infection, so I think that this is less likely.  However, since we are still learning about COVID-19, did advise staying home while sick and consideration of COVID-19 testing.  Discussed options for testing.  Discussed symptomatic care and treatment for colds and flus.  Opted for nasal saline, short course of Afrin nasal decongestant,  staying hydrated.  Offered cough medication; she feels the cough is not too bad right now. Work/School slipped offered:declined  Advised to seek prompt follow up telemedicine visit or in person care if  worsening, new symptoms arise, or if is not improving with treatment. Did let this patient know that I only do telemedicine on Tuesdays and Thursdays for [facility]. Advised to schedule follow up visit with PCP or [facility] if any further questions or concerns to avoid delays in care.   I discussed the assessment and treatment plan with the patient. The patient was provided an opportunity to ask questions and all were answered. The patient agreed with the plan and demonstrated an understanding of the instructions.     Lucretia Kern, DO

## 2020-07-15 NOTE — Patient Instructions (Signed)
-  stay home while sick, and if you have [place] please stay home for a full 10 days since the onset of symptoms PLUS one day of no fever and feeling better  -[place] COVID19 testing information: https://www.[facility].org/ OR ([phone]   -can use tylenol or aleve if needed for fevers, aches and pains per instructions  -can use nasal saline a few times per day if nasal congestion, sometimes a short course of Afrin nasal spray for 3 days can help as well  -stay hydrated, drink plenty of fluids and eat small healthy meals - avoid dairy  -can take 1000 IU Vit D3 and Vit C lozenges per instructions  -follow up with your doctor in 2-3 days unless improving and feeling better  I hope you are feeling better soon! Seek in-person care or a follow up telemedicine visit promptly if your symptoms worsen, new concerns arise or you are not improving as expected. Call 911 if severe symptoms.

## 2020-07-18 DIAGNOSIS — Z20828 Contact with and (suspected) exposure to other viral communicable diseases: Secondary | ICD-10-CM | POA: Diagnosis not present

## 2020-07-21 ENCOUNTER — Other Ambulatory Visit: Payer: BC Managed Care – PPO | Admitting: Adult Health

## 2020-07-21 ENCOUNTER — Other Ambulatory Visit: Payer: Self-pay

## 2020-07-21 ENCOUNTER — Telehealth: Payer: BC Managed Care – PPO | Admitting: Family Medicine

## 2020-07-23 ENCOUNTER — Other Ambulatory Visit: Payer: Self-pay

## 2020-07-23 ENCOUNTER — Telehealth: Payer: Self-pay

## 2020-07-23 ENCOUNTER — Encounter: Payer: Self-pay | Admitting: Family Medicine

## 2020-07-23 ENCOUNTER — Telehealth (INDEPENDENT_AMBULATORY_CARE_PROVIDER_SITE_OTHER): Payer: BC Managed Care – PPO | Admitting: Family Medicine

## 2020-07-23 VITALS — Wt 180.0 lb

## 2020-07-23 DIAGNOSIS — J019 Acute sinusitis, unspecified: Secondary | ICD-10-CM | POA: Diagnosis not present

## 2020-07-23 MED ORDER — AZITHROMYCIN 250 MG PO TABS: ORAL_TABLET | ORAL | 0 refills | Status: DC

## 2020-07-23 NOTE — Addendum Note (Signed)
Addended by: Tammi Sou on: 07/23/2020 04:48 PM   Modules accepted: Orders

## 2020-07-23 NOTE — Progress Notes (Signed)
Virtual Visit via Video Note  I connected with pt on 07/23/20 at  4:00 PM EDT by a video enabled telemedicine application and verified that I am speaking with the correct person using two identifiers.  Location patient: home Location provider:work or home office Persons participating in the virtual visit: patient, provider  I discussed the limitations of evaluation and management by telemedicine and the availability of in person appointments. The patient expressed understanding and agreed to proceed.  Telemedicine visit is a necessity given the COVID-19 restrictions in place at the current time.  HPI: 34 y/o WF being seen today for sinus pressure and headache. About 10-11 d/a woke up with ST, head/nasal congestion, lots of sneezing, a little cough, and HA.  Sx's have persisted. Lots of pressure in head, getting bad HAs. No wheezing or SOB.  "Feel like it's all in my head area and not in my chest". No fever.  She got covid tested 10/1 and it was NEG. Tried aleve and tylenol cold med formulations.  The med helps just a little. Using sinex nasal spray--vicks product.   07/15/20 virtual visit with Dr. Maudie Mercury reviewed today.  Three d of URI w/cough at that time. Covid was felt unlikely b/c of pt's hx of prior covid infection PLUS she has been fully vaccinated for about 6 mo.  However, covid retesting was recommended by Dr. Maudie Mercury.   ROS: See pertinent positives and negatives per HPI.  Past Medical History:  Diagnosis Date  . 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 Contraceptive management 07/23/2014  . Fibromyalgia    neck, bilat shoulders, thighs, upper chest wall, traps  . GAD (generalized anxiety disorder)    with panic  . Hypothyroidism 02/10/2016  . Menstrual headache 03/06/2015  . 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      Past Surgical History:  Procedure Laterality Date  . bladder stem     . CESAREAN SECTION  11/16/2012   Procedure: CESAREAN SECTION;  Surgeon: Florian Buff, MD;  Location: [facility];  Service: Obstetrics;  Laterality: N/A;  . CESAREAN SECTION WITH BILATERAL TUBAL LIGATION Bilateral 05/18/2017   Procedure: CESAREAN SECTION WITH BILATERAL TUBAL LIGATION;  Surgeon: Florian Buff, MD;  Location: [facility];  Service: Obstetrics;  Laterality: Bilateral;  . WISDOM TOOTH EXTRACTION       Current Outpatient Medications:  .  FLUoxetine (PROZAC) 40 MG capsule, Take 1 capsule by mouth in the morning, Disp: 30 capsule, Rfl: 0 .  levothyroxine (SYNTHROID) 75 MCG tablet, Take 75 mcg by mouth daily before breakfast., Disp: , Rfl:   EXAM:  VITALS per patient if applicable:  Vitals with BMI 07/23/2020 07/30/2019 06/06/2019  Height - 5\' 4"  5\' 4"   Weight 180 lbs 184 lbs 176 lbs  BMI - 09.47 09.6  Systolic - 283 662  Diastolic - 78 70  Pulse - 70 85     GENERAL: alert, oriented, appears well and in no acute distress  HEENT: atraumatic, conjunttiva clear, no obvious abnormalities on inspection of external nose and ears She is sniffing and it sounds like she has nasal congestion and some mucous. Rare cough. No facial swelling or erythema.  NECK: normal movements of the head and neck  LUNGS: on inspection no signs of respiratory distress, breathing rate appears normal, no obvious gross SOB, gasping or wheezing  CV:  no obvious cyanosis  MS: moves all visible extremities without noticeable abnormality  PSYCH/NEURO: pleasant and cooperative, no obvious depression or anxiety, speech and thought processing grossly intact  LABS: none today  Lab Results  Component Value Date   TSH 7.680 (H) 05/01/2020    ASSESSMENT AND PLAN:  Discussed the following assessment and plan:  Acute bacterial sinusitis suspected.  Covid test 5 d/a was NEG (she was tested about 5 days into the  illness). Z-pack eRx'd. Continue irrigation/moisturization nasal spray and otc tylenol cold med. Rest and push fluids.  -we discussed possible serious and likely etiologies, options for evaluation and workup, limitations of telemedicine visit vs in person visit, treatment, treatment risks and precautions. Pt prefers to treat via telemedicine empirically rather than in person at this moment.     I discussed the assessment and treatment plan with the patient. The patient was provided an opportunity to ask questions and all were answered. The patient agreed with the plan and demonstrated an understanding of the instructions.    F/u: if not starting to improve in 2d or if worsening before that  Signed:  Crissie Sickles, MD           07/23/2020

## 2020-07-23 NOTE — Telephone Encounter (Signed)
Patient scheduled for virtual appt this afternoon.  Patient husband called for her (DPR) requesting an in office appt. Negative COVID test - entire family. Patient is having severe headache due to sinus pressure , no fever, very weak.  I told patient husband appt needed to be virtual, but I would send note to Dr. Anitra Lauth. For in office appt approval.  Please advise.  [phone]

## 2020-07-23 NOTE — Telephone Encounter (Signed)
Patient needs to keep appt as virtual

## 2020-08-11 ENCOUNTER — Other Ambulatory Visit: Payer: Self-pay | Admitting: Family Medicine

## 2020-08-14 ENCOUNTER — Telehealth: Payer: Self-pay | Admitting: Adult Health

## 2020-08-14 NOTE — Telephone Encounter (Signed)
Pt requesting refill on FLUOXETINE 40mg , states Dr. Anitra Lauth as been filling it for her but last RF was only for 2 weeks & she will run out before her appt with [name] 09/01/2020 - please advise & call pt   [facility]

## 2020-08-15 MED ORDER — FLUOXETINE HCL 40 MG PO CAPS: ORAL_CAPSULE | ORAL | 0 refills | Status: DC

## 2020-08-15 NOTE — Telephone Encounter (Signed)
Refilled prozac has appt in november

## 2020-08-15 NOTE — Addendum Note (Signed)
Addended by: Derrek Monaco A on: 08/15/2020 03:51 PM   Modules accepted: Orders

## 2020-09-01 ENCOUNTER — Other Ambulatory Visit: Payer: BC Managed Care – PPO | Admitting: Adult Health

## 2020-09-09 ENCOUNTER — Other Ambulatory Visit: Payer: Self-pay | Admitting: Family Medicine

## 2020-09-26 ENCOUNTER — Other Ambulatory Visit: Payer: Self-pay | Admitting: Adult Health

## 2020-10-08 ENCOUNTER — Other Ambulatory Visit: Payer: BC Managed Care – PPO | Admitting: Adult Health

## 2020-11-20 ENCOUNTER — Other Ambulatory Visit: Payer: Self-pay | Admitting: Adult Health

## 2020-12-09 ENCOUNTER — Ambulatory Visit (INDEPENDENT_AMBULATORY_CARE_PROVIDER_SITE_OTHER): Payer: BC Managed Care – PPO | Admitting: Adult Health

## 2020-12-09 ENCOUNTER — Other Ambulatory Visit (HOSPITAL_COMMUNITY)
Admission: RE | Admit: 2020-12-09 | Discharge: 2020-12-09 | Disposition: A | Discharge status: Home / Self Care | Payer: BC Managed Care – PPO | Source: Ambulatory Visit | Attending: Adult Health | Admitting: Adult Health

## 2020-12-09 ENCOUNTER — Encounter: Payer: Self-pay | Admitting: Adult Health

## 2020-12-09 ENCOUNTER — Other Ambulatory Visit: Payer: Self-pay

## 2020-12-09 VITALS — BP 128/85 | HR 75 | Ht 65.5 in | Wt 186.0 lb

## 2020-12-09 DIAGNOSIS — E039 Hypothyroidism, unspecified: Secondary | ICD-10-CM | POA: Diagnosis not present

## 2020-12-09 DIAGNOSIS — Z01419 Encounter for gynecological examination (general) (routine) without abnormal findings: Secondary | ICD-10-CM | POA: Insufficient documentation

## 2020-12-09 DIAGNOSIS — N62 Hypertrophy of breast: Secondary | ICD-10-CM | POA: Diagnosis not present

## 2020-12-09 DIAGNOSIS — F418 Other specified anxiety disorders: Secondary | ICD-10-CM

## 2020-12-09 DIAGNOSIS — M25519 Pain in unspecified shoulder: Secondary | ICD-10-CM

## 2020-12-09 DIAGNOSIS — N926 Irregular menstruation, unspecified: Secondary | ICD-10-CM

## 2020-12-09 DIAGNOSIS — M542 Cervicalgia: Secondary | ICD-10-CM

## 2020-12-09 DIAGNOSIS — N946 Dysmenorrhea, unspecified: Secondary | ICD-10-CM

## 2020-12-09 DIAGNOSIS — D126 Benign neoplasm of colon, unspecified: Secondary | ICD-10-CM

## 2020-12-09 MED ORDER — FLUOXETINE HCL 40 MG PO CAPS: 40.0000 mg | ORAL_CAPSULE | Freq: Every morning | ORAL | 3 refills | Status: DC

## 2020-12-09 MED ORDER — LO LOESTRIN FE 1 MG-10 MCG / 10 MCG PO TABS: 1.0000 | ORAL_TABLET | Freq: Every day | ORAL | 0 refills | Status: DC

## 2020-12-09 NOTE — Progress Notes (Signed)
Patient ID: Erica Farley, female   DOB: 1986-08-05, 35 y.o.   MRN: [id number] History of Present Illness: Erica Farley is a 35 year old white female,married, G2P 2 in for a well woman gyn exam and pap. She wants labs.  PCP is Dr Ernestine Conrad   Current Medications, Allergies, Past Medical History, Past Surgical History, Family History and Social History were reviewed in [place] [facility] record.     Review of Systems: Patient denies any headaches, hearing loss, fatigue, blurred vision, shortness of breath, chest pain, abdominal pain, problems with bowel movements, urination, or intercourse. No joint pain or mood swings. Periods not as regular has clots and cramps, has some PMS and breast tenderness before periods Has pain in neck and shoulders from heavy breast    Physical Exam:BP 128/85 (BP Location: Left Arm, Patient Position: Sitting, Cuff Size: Normal)   Pulse 75   Ht 5' 5.5" (1.664 m)   Wt 186 lb (84.4 kg)   LMP 12/05/2020   BMI 30.48 kg/m  General:  Well developed, well nourished, no acute distress Skin:  Warm and dry Neck:  Midline trachea, normal thyroid, good ROM, no lymphadenopathy Lungs; Clear to auscultation bilaterally Breast:  No dominant palpable mass, retraction, or nipple discharge,large, wears 40 DDD Cardiovascular: Regular rate and rhythm Abdomen:  Soft, non tender, no hepatosplenomegaly Pelvic:  External genitalia is normal in appearance, no lesions.  The vagina is normal in appearance,+period blood. Urethra has no lesions or masses. The cervix is bulbous. Pap with HR HPV genotyping performed.  Uterus is felt to be normal size, shape, and contour.  No adnexal masses or tenderness noted.Bladder is non tender, no masses felt. Extremities/musculoskeletal:  No swelling or varicosities noted, no clubbing or cyanosis Psych:  No mood changes, alert and cooperative,seems happy AA is 1  Fall risk is low PHQ 9 score is 6, no prozac GAD 7 score is 7  [facility]  - 12/09/20 1041      Pregnancy Intention Screening   Does the patient want to become pregnant in the next year? No    Does the patient's partner want to become pregnant in the next year? No    Would the patient like to discuss contraceptive options today? No      Contraception Wrap Up   Current Method Female Sterilization    End Method Female Sterilization    Contraception Counseling Provided No         Co exam with Tinnie Gens NP student   Impression and Plan; 1. Encounter for gynecological examination with Papanicolaou smear of cervix Pap sent Physical in 1 year Pap in 3 if normal Check CBC,CMP and lipids  2. Hypothyroidism, unspecified type Check TSH and free T4 On Euthyrox 75 mcg at home  3. Depression with anxiety Will continue prozac   Meds ordered this encounter  Medications  . FLUoxetine (PROZAC) 40 MG capsule    Sig: Take 1 capsule (40 mg total) by mouth every morning.    Dispense:  90 capsule    Refill:  3    Order Specific Question:   Supervising Provider    Answer:   Elonda Husky, LUTHER H [[id number]]  . Norethindrone-Ethinyl Estradiol-Fe Biphas (LO LOESTRIN FE) 1 MG-10 MCG / 10 MCG tablet    Sig: Take 1 tablet by mouth daily. Take 1 daily by mouth    Dispense:  112 tablet    Refill:  0    BIN [id number], PCN [id number], GRP [id number]  Order Specific Question:   Supervising Provider    Answer:   Tania Ade H [[id number]]    4. Large breasts, she wants breast reduction  Given number for 3 plastic surgeons in [place]  5. Neck and shoulder pain,due ot large breasts   6. 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 Dysmenorrhea Will try lo Loestrin 4 packs given can start today  8. Irregular periods Start lo Loestrin today Follow up in 3 months

## 2020-12-10 ENCOUNTER — Telehealth: Payer: Self-pay | Admitting: Adult Health

## 2020-12-10 LAB — COMPREHENSIVE METABOLIC PANEL
ALT: 15 IU/L (ref 0–32)
AST: 17 IU/L (ref 0–40)
Albumin/Globulin Ratio: 1.8 (ref 1.2–2.2)
Albumin: 4.6 g/dL (ref 3.8–4.8)
Alkaline Phosphatase: 57 IU/L (ref 44–121)
BUN/Creatinine Ratio: 20 (ref 9–23)
BUN: 17 mg/dL (ref 6–20)
Bilirubin Total: 0.4 mg/dL (ref 0.0–1.2)
CO2: 21 mmol/L (ref 20–29)
Calcium: 9.3 mg/dL (ref 8.7–10.2)
Chloride: 106 mmol/L (ref 96–106)
Creatinine, Ser: 0.85 mg/dL (ref 0.57–1.00)
GFR calc Af Amer: 103 mL/min/{1.73_m2} (ref >59)
GFR calc non Af Amer: 90 mL/min/{1.73_m2} (ref >59)
Globulin, Total: 2.5 g/dL (ref 1.5–4.5)
Glucose: 72 mg/dL (ref 65–99)
Potassium: 4.5 mmol/L (ref 3.5–5.2)
Sodium: 143 mmol/L (ref 134–144)
Total Protein: 7.1 g/dL (ref 6.0–8.5)

## 2020-12-10 LAB — CBC
Hematocrit: 42.3 % (ref 34.0–46.6)
Hemoglobin: 14.3 g/dL (ref 11.1–15.9)
MCH: 31.8 pg (ref 26.6–33.0)
MCHC: 33.8 g/dL (ref 31.5–35.7)
MCV: 94 fL (ref 79–97)
Platelets: 273 10*3/uL (ref 150–450)
RBC: 4.49 x10E6/uL (ref 3.77–5.28)
RDW: 12.6 % (ref 11.7–15.4)
WBC: 6.4 10*3/uL (ref 3.4–10.8)

## 2020-12-10 LAB — LIPID PANEL
Chol/HDL Ratio: 4.3 ratio (ref 0.0–4.4)
Cholesterol, Total: 255 mg/dL — ABNORMAL HIGH (ref 100–199)
HDL: 60 mg/dL (ref >39)
LDL Chol Calc (NIH): 174 mg/dL — ABNORMAL HIGH (ref 0–99)
Triglycerides: 119 mg/dL (ref 0–149)
VLDL Cholesterol Cal: 21 mg/dL (ref 5–40)

## 2020-12-10 LAB — T4, FREE: Free T4: 1.23 ng/dL (ref 0.82–1.77)

## 2020-12-10 LAB — TSH: TSH: 4.92 u[IU]/mL — ABNORMAL HIGH (ref 0.450–4.500)

## 2020-12-10 MED ORDER — LEVOTHYROXINE SODIUM 88 MCG PO TABS: 88.0000 ug | ORAL_TABLET | Freq: Every day | ORAL | 3 refills | Status: DC

## 2020-12-10 NOTE — Telephone Encounter (Signed)
Pt aware of labs, will increase euthyrox to 88 mcg and recheck in May, decrease carbs and fats as cholesterol slightly elevated

## 2020-12-11 LAB — CYTOLOGY - PAP
Comment: NEGATIVE
Diagnosis: NEGATIVE
High risk HPV: NEGATIVE

## 2021-03-09 ENCOUNTER — Ambulatory Visit: Payer: BC Managed Care – PPO | Admitting: Adult Health

## 2021-04-13 ENCOUNTER — Other Ambulatory Visit: Payer: Self-pay | Admitting: Adult Health

## 2021-04-16 ENCOUNTER — Telehealth: Payer: Self-pay | Admitting: *Deleted

## 2021-04-16 DIAGNOSIS — E039 Hypothyroidism, unspecified: Secondary | ICD-10-CM

## 2021-04-16 MED ORDER — LEVOTHYROXINE SODIUM 88 MCG PO TABS: 88.0000 ug | ORAL_TABLET | Freq: Every day | ORAL | 1 refills | Status: DC

## 2021-04-16 MED ORDER — FLUOXETINE HCL 40 MG PO CAPS: 40.0000 mg | ORAL_CAPSULE | Freq: Every morning | ORAL | 3 refills | Status: DC

## 2021-04-16 NOTE — Telephone Encounter (Signed)
Left message that I refilled Prozac to [facility] but not thyroid meds did that one at [facility], you need labs order placed, so go get thyroid checked.

## 2021-04-16 NOTE — Addendum Note (Signed)
Addended by: Derrek Monaco A on: 04/16/2021 01:39 PM   Modules accepted: Orders

## 2021-04-16 NOTE — Telephone Encounter (Signed)
Received fax from [facility]. Patient would like to have her Euthyrox and Fluoxetine sent there.

## 2021-04-22 DIAGNOSIS — E039 Hypothyroidism, unspecified: Secondary | ICD-10-CM | POA: Diagnosis not present

## 2021-04-23 LAB — TSH: TSH: 3.74 u[IU]/mL (ref 0.450–4.500)

## 2021-06-23 ENCOUNTER — Other Ambulatory Visit: Payer: Self-pay | Admitting: Adult Health

## 2021-07-08 ENCOUNTER — Other Ambulatory Visit: Payer: Self-pay | Admitting: Adult Health

## 2021-08-06 ENCOUNTER — Ambulatory Visit (INDEPENDENT_AMBULATORY_CARE_PROVIDER_SITE_OTHER): Payer: BC Managed Care – PPO

## 2021-08-06 ENCOUNTER — Other Ambulatory Visit: Payer: Self-pay

## 2021-08-06 DIAGNOSIS — Z23 Encounter for immunization: Secondary | ICD-10-CM

## 2021-08-17 ENCOUNTER — Other Ambulatory Visit: Payer: Self-pay | Admitting: Adult Health

## 2021-09-03 ENCOUNTER — Encounter: Payer: Self-pay | Admitting: Family Medicine

## 2021-09-03 ENCOUNTER — Telehealth (INDEPENDENT_AMBULATORY_CARE_PROVIDER_SITE_OTHER): Payer: BC Managed Care – PPO | Admitting: Family Medicine

## 2021-09-03 VITALS — Temp 96.8°F | Wt 176.0 lb

## 2021-09-03 DIAGNOSIS — R110 Nausea: Secondary | ICD-10-CM | POA: Diagnosis not present

## 2021-09-03 DIAGNOSIS — R197 Diarrhea, unspecified: Secondary | ICD-10-CM

## 2021-09-03 MED ORDER — ONDANSETRON HCL 4 MG PO TABS: 4.0000 mg | ORAL_TABLET | Freq: Three times a day (TID) | ORAL | 0 refills | Status: DC | PRN

## 2021-09-03 NOTE — Progress Notes (Signed)
Virtual Visit via Video Note  I connected with Erica Farley  on 09/03/21 at 12:20 PM EST by a video enabled telemedicine application and verified that I am speaking with the correct person using two identifiers.  Location patient: home, [place] Location provider:work or home office Persons participating in the virtual visit: patient, provider  I discussed the limitations of evaluation and management by telemedicine and the availability of in person appointments. The patient expressed understanding and agreed to proceed.   HPI:  Acute telemedicine visit for Diarrhea: -Onset: about 3-4 days ago -Symptoms include: upset stomach, some cramping intermittently, nausea, diarrhea - multiple episodes of watery diarrhea the first day, some sore throat, nasal congestion -Denies: fever, vomiting, melena, hematochezia, inability to eat and drink, recent travel, recent abx -the only think different she ate was vegetable soup and she does not like mushrooms - nobody else got sick that ate this -son is in preschool -Has tried:pepto -Pertinent past medical history: see below -Pertinent medication allergies: No Known Allergies -COVID-19 vaccine status:  Immunization History  Administered Date(s) Administered   Influenza Inj Mdck Quad With Preservative 10/20/2018   Influenza,inj,Quad PF,6+ Mos 11/17/2016, 07/30/2019, 08/06/2021   Influenza-Unspecified 10/21/2020   Janssen (J&J) SARS-COV-2 Vaccination 01/17/2020   Moderna Sars-Covid-2 Vaccination 10/21/2020   Tdap 11/17/2012, 03/04/2017  -just started menstrual cycle today  ROS: See pertinent positives and negatives per HPI.  Past Medical History:  Diagnosis Date   Chronic headaches 07/23/2014   L side TMJ   Contraceptive management 07/23/2014   Fibromyalgia    neck, bilat shoulders, thighs, upper chest wall, traps   GAD (generalized anxiety disorder)    with panic   Hypothyroidism 02/10/2016   Menstrual headache 03/06/2015   Polyposis associated with  heterozygous mutation in MUTYH gene 04/30/2020   +MUTYH associtaed polyposis c 1187G>A heterozygous  Increased colorectal 4.20% in lifetime she is taking with Dietitian.     Past Surgical History:  Procedure Laterality Date   bladder stem      CESAREAN SECTION  11/16/2012   Procedure: CESAREAN SECTION;  Surgeon: Florian Buff, MD;  Location: [facility];  Service: Obstetrics;  Laterality: N/A;   CESAREAN SECTION WITH BILATERAL TUBAL LIGATION Bilateral 05/18/2017   Procedure: CESAREAN SECTION WITH BILATERAL TUBAL LIGATION;  Surgeon: Florian Buff, MD;  Location: [facility];  Service: Obstetrics;  Laterality: Bilateral;   WISDOM TOOTH EXTRACTION       Current Outpatient Medications:    EUTHYROX 88 MCG tablet, Take 1 tablet by mouth once daily before breakfast. **Please schedule appointment with your physician for lab work.**, Disp: 30 tablet, Rfl: 0   FLUoxetine (PROZAC) 40 MG capsule, Take 1 capsule (40 mg total) by mouth every morning., Disp: 90 capsule, Rfl: 3   Multiple Vitamin (MULTIVITAMIN) capsule, Take 1 capsule by mouth daily., Disp: , Rfl:    ondansetron (ZOFRAN) 4 MG tablet, Take 1 tablet (4 mg total) by mouth every 8 (eight) hours as needed for nausea or vomiting., Disp: 20 tablet, Rfl: 0  EXAM:  VITALS per patient if applicable:  GENERAL: alert, oriented, appears well and in no acute distress  HEENT: atraumatic, conjunttiva clear, no obvious abnormalities on inspection of external nose and ears  NECK: normal movements of the head and neck  LUNGS: on inspection no signs of respiratory distress, breathing rate appears normal, no obvious gross SOB, gasping or wheezing  CV: no obvious cyanosis  MS: moves all visible extremities without noticeable abnormality  PSYCH/NEURO: pleasant and cooperative, no  obvious depression or anxiety, speech and thought processing grossly intact  ASSESSMENT AND PLAN:  Discussed the following assessment and plan:  Diarrhea,  unspecified type  Nausea  -we discussed possible serious and likely etiologies, options for evaluation and workup, limitations of telemedicine visit vs in person visit, treatment, treatment risks and precautions. Pt is agreeable to treatment via telemedicine at this moment. Query gastroenteritis, menstrual related vs other. She opted for trial imodium, zofran, aleve for menstrual issues and oral hydration. Advised to seek prompt vv follow up or in person care if worsening, new symptoms arise, or if is not improving with treatment. Discussed options for inperson care if PCP office not available. Did let this patient know that I only do telemedicine on Tuesdays and Thursdays for [facility]. Advised to schedule follow up visit with PCP or UCC if any further questions or concerns to avoid delays in care.   I discussed the assessment and treatment plan with the patient. The patient was provided an opportunity to ask questions and all were answered. The patient agreed with the plan and demonstrated an understanding of the instructions.     Lucretia Kern, DO

## 2021-09-03 NOTE — Patient Instructions (Addendum)
-  imodium per instructions as needed  -no dairy products for 1-2 weeks  -drink plenty of fluids  -Aleve if needed for cramps  -I sent the medication(s) we discussed to your pharmacy: Meds ordered this encounter  Medications   ondansetron (ZOFRAN) 4 MG tablet    Sig: Take 1 tablet (4 mg total) by mouth every 8 (eight) hours as needed for nausea or vomiting.    Dispense:  20 tablet    Refill:  0     I hope you are feeling better soon!  Seek in person care promptly if your symptoms worsen, new concerns arise or you are not improving with treatment.  It was nice to meet you today. I help [facility] out with telemedicine visits on Tuesdays and Thursdays and am available for visits on those days. If you have any concerns or questions following this visit please schedule a follow up visit with your Primary Care doctor or seek care at a local urgent care clinic to avoid delays in care.

## 2021-09-07 ENCOUNTER — Ambulatory Visit: Payer: BC Managed Care – PPO | Admitting: Family Medicine

## 2021-09-22 ENCOUNTER — Other Ambulatory Visit: Payer: Self-pay | Admitting: Adult Health

## 2021-09-27 ENCOUNTER — Other Ambulatory Visit: Payer: Self-pay | Admitting: Adult Health

## 2021-09-28 ENCOUNTER — Telehealth: Payer: Self-pay | Admitting: Adult Health

## 2021-09-28 DIAGNOSIS — E039 Hypothyroidism, unspecified: Secondary | ICD-10-CM

## 2021-09-28 NOTE — Addendum Note (Signed)
Addended by: Derrek Monaco A on: 09/28/2021 10:37 AM   Modules accepted: Orders

## 2021-09-28 NOTE — Telephone Encounter (Signed)
Patient called stating that she would like for Erica Farley to place an order for her to check her Thyroid. Patient would like to know that her medication is still working. Please contact pt

## 2021-09-28 NOTE — Telephone Encounter (Signed)
Pt aware that orders in can go anytime this week

## 2021-10-02 DIAGNOSIS — E039 Hypothyroidism, unspecified: Secondary | ICD-10-CM | POA: Diagnosis not present

## 2021-10-03 LAB — T4, FREE: Free T4: 1.19 ng/dL (ref 0.82–1.77)

## 2021-10-03 LAB — TSH: TSH: 3.78 u[IU]/mL (ref 0.450–4.500)

## 2021-10-07 ENCOUNTER — Other Ambulatory Visit: Payer: Self-pay | Admitting: Adult Health

## 2021-12-24 ENCOUNTER — Other Ambulatory Visit: Payer: Self-pay

## 2021-12-24 ENCOUNTER — Telehealth (INDEPENDENT_AMBULATORY_CARE_PROVIDER_SITE_OTHER): Payer: BC Managed Care – PPO | Admitting: Family Medicine

## 2021-12-24 ENCOUNTER — Encounter: Payer: Self-pay | Admitting: Family Medicine

## 2021-12-24 VITALS — Temp 97.3°F | Wt 180.0 lb

## 2021-12-24 DIAGNOSIS — J069 Acute upper respiratory infection, unspecified: Secondary | ICD-10-CM

## 2021-12-24 MED ORDER — AZITHROMYCIN 250 MG PO TABS: ORAL_TABLET | ORAL | 0 refills | Status: DC

## 2021-12-24 MED ORDER — BENZONATATE 200 MG PO CAPS: 200.0000 mg | ORAL_CAPSULE | Freq: Two times a day (BID) | ORAL | 0 refills | Status: DC | PRN

## 2021-12-24 NOTE — Progress Notes (Signed)
Virtual Visit via Video Note ? ?I connected with Erica Farley on 12/24/21 at  2:20 PM EST by a video enabled telemedicine application and verified that I am speaking with the correct person using two identifiers. ? Location patient: Erica Farley ?Location provider:work or home office ?Persons participating in the virtual visit: patient, provider ? ?I discussed the limitations and requested verbal permission for telemedicine visit. The patient expressed understanding and agreed to proceed. ? ? ?HPI: ?36 y/o female being seen today for respiratory symptoms. ?Onset 5 days ago--sore throat, nasal congestion/runny nose, cough. ?Symptoms have all gradually progressed/worsened.  Denies wheezing or shortness of breath or fever.  No nausea vomiting or diarrhea. ? ? ?Immunization History  ?Administered Date(s) Administered  ? Influenza Inj Mdck Quad With Preservative 10/20/2018  ? Influenza,inj,Quad PF,6+ Mos 11/17/2016, 07/30/2019, 08/06/2021  ? Influenza-Unspecified 10/21/2020  ? Janssen (J&J) SARS-COV-2 Vaccination 01/17/2020  ? Moderna Sars-Covid-2 Vaccination 10/21/2020  ? Tdap 11/17/2012, 03/04/2017  ? ? ? ?ROS: See pertinent positives and negatives per HPI. ? ?Past Medical History:  ?Diagnosis Date  ? Chronic headaches 07/23/2014  ? L side TMJ  ? Contraceptive management 07/23/2014  ? Fibromyalgia   ? neck, bilat shoulders, thighs, upper chest wall, traps  ? GAD (generalized anxiety disorder)   ? with panic  ? Hypothyroidism 02/10/2016  ? Menstrual headache 03/06/2015  ? 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 +MUTYH associtaed polyposis c 1187G>A heterozygous  Increased colorectal 4.20% in lifetime she is taking with genetic counselor.   ? ? ?Past Surgical History:  ?Procedure Laterality Date  ? bladder stem     ? CESAREAN SECTION  11/16/2012  ? Procedure: CESAREAN SECTION;  Surgeon: Florian Buff, MD;  Location: [facility];  Service: Obstetrics;  Laterality: N/A;  ? CESAREAN SECTION WITH BILATERAL TUBAL  LIGATION Bilateral 05/18/2017  ? Procedure: CESAREAN SECTION WITH BILATERAL TUBAL LIGATION;  Surgeon: Florian Buff, MD;  Location: [facility];  Service: Obstetrics;  Laterality: Bilateral;  ? WISDOM TOOTH EXTRACTION    ? ? ? ?Current Outpatient Medications:  ?  azithromycin (ZITHROMAX) 250 MG tablet, 2 tabs po qd x 1d, then 1 tab po qd x 4d, Disp: 6 tablet, Rfl: 0 ?  benzonatate (TESSALON) 200 MG capsule, Take 1 capsule (200 mg total) by mouth 2 (two) times daily as needed for cough., Disp: 20 capsule, Rfl: 0 ?  EUTHYROX 88 MCG tablet, Take 1 tablet by mouth once daily before breakfast. **Please schedule appointment with your physician for lab work. Last refill.**, Disp: 90 tablet, Rfl: 1 ?  FLUoxetine (PROZAC) 40 MG capsule, Take 1 capsule (40 mg total) by mouth every morning., Disp: 90 capsule, Rfl: 3 ?  Multiple Vitamin (MULTIVITAMIN) capsule, Take 1 capsule by mouth daily., Disp: , Rfl:  ? ?EXAM: ? ?VITALS per patient if applicable:  ?Vitals with BMI 12/24/2021 09/03/2021 12/09/2020  ?Height - - 5' 5.5"  ?Weight 180 lbs 176 lbs 186 lbs  ?BMI - - 30.47  ?Systolic - - 742  ?Diastolic - - 85  ?Pulse - - 75  ?Temp 97.3 ? ?GENERAL: alert, oriented, appears well and in no acute distress ? ?HEENT: atraumatic, conjunttiva clear, no obvious abnormalities on inspection of external nose and ears ? ?NECK: normal movements of the head and neck ? ?LUNGS: on inspection no signs of respiratory distress, breathing rate appears normal, no obvious gross SOB, gasping or wheezing ? ?CV: no obvious cyanosis ? ?MS: moves all visible extremities without noticeable abnormality ? ?  PSYCH/NEURO: pleasant and cooperative, no obvious depression or anxiety, speech and thought processing grossly intact ? ?LABS: none today ? ?  Chemistry   ?   ?Component Value Date/Time  ? NA 143 12/09/2020 1130  ? K 4.5 12/09/2020 1130  ? CL 106 12/09/2020 1130  ? CO2 21 12/09/2020 1130  ? BUN 17 12/09/2020 1130  ? CREATININE 0.85 12/09/2020 1130  ?     ?Component Value Date/Time  ? CALCIUM 9.3 12/09/2020 1130  ? ALKPHOS 57 12/09/2020 1130  ? AST 17 12/09/2020 1130  ? ALT 15 12/09/2020 1130  ? BILITOT 0.4 12/09/2020 1130  ?  ? ?Lab Results  ?Component Value Date  ? TSH 3.780 10/02/2021  ? ?ASSESSMENT AND PLAN: ? ?Discussed the following assessment and plan: ? ?URI with cough and congestion. ?Symptoms progressing and present for 5 days now. ?Start Tessalon Perles 200 mg twice daily as needed cough. ?Azithromycin x5 days. ?Saline nasal spray. ? ?I discussed the assessment and treatment plan with the patient. The patient was provided an opportunity to ask questions and all were answered. The patient agreed with the plan and demonstrated an understanding of the instructions. ?  ?F/u: If not improving significantly in the next 4 to 5 days ? ?Signed:  Crissie Sickles, MD           12/24/2021 ? ?

## 2022-02-26 ENCOUNTER — Encounter: Payer: Self-pay | Admitting: *Deleted

## 2022-02-26 ENCOUNTER — Other Ambulatory Visit: Payer: Self-pay | Admitting: Adult Health

## 2022-02-26 DIAGNOSIS — E039 Hypothyroidism, unspecified: Secondary | ICD-10-CM

## 2022-02-26 NOTE — Progress Notes (Signed)
Ck thyroid  ?

## 2022-03-03 LAB — TSH: TSH: 3 u[IU]/mL (ref 0.450–4.500)

## 2022-03-03 LAB — T4, FREE: Free T4: 1.43 ng/dL (ref 0.82–1.77)

## 2022-03-26 ENCOUNTER — Other Ambulatory Visit: Payer: Self-pay | Admitting: Adult Health

## 2022-04-16 ENCOUNTER — Other Ambulatory Visit: Payer: Self-pay | Admitting: *Deleted

## 2022-04-16 MED ORDER — LEVOTHYROXINE SODIUM 88 MCG PO TABS: 88.0000 ug | ORAL_TABLET | Freq: Every day | ORAL | 3 refills | Status: DC

## 2022-07-14 ENCOUNTER — Telehealth: Payer: Self-pay

## 2022-07-14 DIAGNOSIS — E039 Hypothyroidism, unspecified: Secondary | ICD-10-CM

## 2022-07-14 NOTE — Telephone Encounter (Signed)
Pt aware labs in for thyroid

## 2022-07-14 NOTE — Addendum Note (Signed)
Addended by: Derrek Monaco A on: 07/14/2022 01:29 PM   Modules accepted: Orders

## 2022-07-14 NOTE — Telephone Encounter (Signed)
Patient would like [name] to put in orders for blood work to get her Thyroid levels checked.

## 2022-07-17 LAB — TSH+FREE T4
Free T4: 1.37 ng/dL (ref 0.82–1.77)
TSH: 2.05 u[IU]/mL (ref 0.450–4.500)

## 2022-10-14 ENCOUNTER — Other Ambulatory Visit: Payer: Self-pay | Admitting: Adult Health

## 2022-12-29 ENCOUNTER — Other Ambulatory Visit: Payer: Self-pay | Admitting: Adult Health

## 2023-03-29 ENCOUNTER — Ambulatory Visit: Payer: Managed Care, Other (non HMO) | Admitting: Adult Health

## 2023-05-10 ENCOUNTER — Other Ambulatory Visit (HOSPITAL_COMMUNITY)
Admission: RE | Admit: 2023-05-10 | Discharge: 2023-05-10 | Disposition: A | Discharge status: Home / Self Care | Payer: Managed Care, Other (non HMO) | Source: Ambulatory Visit | Attending: Adult Health | Admitting: Adult Health

## 2023-05-10 ENCOUNTER — Ambulatory Visit (INDEPENDENT_AMBULATORY_CARE_PROVIDER_SITE_OTHER): Payer: Managed Care, Other (non HMO) | Admitting: Adult Health

## 2023-05-10 ENCOUNTER — Encounter: Payer: Self-pay | Admitting: Adult Health

## 2023-05-10 VITALS — BP 124/84 | HR 72 | Ht 65.0 in | Wt 163.0 lb

## 2023-05-10 DIAGNOSIS — F32A Depression, unspecified: Secondary | ICD-10-CM

## 2023-05-10 DIAGNOSIS — N946 Dysmenorrhea, unspecified: Secondary | ICD-10-CM

## 2023-05-10 DIAGNOSIS — E039 Hypothyroidism, unspecified: Secondary | ICD-10-CM

## 2023-05-10 DIAGNOSIS — Z13220 Encounter for screening for lipoid disorders: Secondary | ICD-10-CM

## 2023-05-10 DIAGNOSIS — F419 Anxiety disorder, unspecified: Secondary | ICD-10-CM

## 2023-05-10 DIAGNOSIS — Z01419 Encounter for gynecological examination (general) (routine) without abnormal findings: Secondary | ICD-10-CM | POA: Insufficient documentation

## 2023-05-10 DIAGNOSIS — N62 Hypertrophy of breast: Secondary | ICD-10-CM

## 2023-05-10 DIAGNOSIS — N920 Excessive and frequent menstruation with regular cycle: Secondary | ICD-10-CM | POA: Diagnosis not present

## 2023-05-10 NOTE — Progress Notes (Signed)
Patient ID: Erica Farley, female   DOB: 30-Oct-1985, 37 y.o.   MRN: [id number] History of Present Illness:  Erica Farley is a 37 year old white female,married, G2P2002, in for well woman gyn exam and pap. She says periods heavy for about 2 days, may change pad every 2 hours and has bad cramps, and feels emotional. Tried BCP in past, felt worse.  PCP is Dr Milinda Cave   Current Medications, Allergies, Past Medical History, Past Surgical History, Family History and Social History were reviewed in [employer] electronic medical record.     Review of Systems: Patient denies any headaches, hearing loss, fatigue, blurred vision, shortness of breath, chest pain, abdominal pain, problems with bowel movements, urination, or intercourse. No joint pain or mood swings.  See HPI for positives.   Physical Exam:BP 124/84 (BP Location: Left Arm, Patient Position: Sitting, Cuff Size: Normal)   Pulse 72   Ht 5\' 5"  (1.651 m)   Wt 163 lb (73.9 kg)   LMP 05/03/2023   BMI 27.12 kg/m   General:  Well developed, well nourished, no acute distress Skin:  Warm and dry Neck:  Midline trachea, normal thyroid, good ROM, no lymphadenopathy Lungs; Clear to auscultation bilaterally Breast:  No dominant palpable mass, retraction, or nipple discharge,has large breasts  Cardiovascular: Regular rate and rhythm Abdomen:  Soft, non tender, no hepatosplenomegaly Pelvic:  External genitalia is normal in appearance, no lesions.  The vagina is normal in appearance, +blood. Urethra has no lesions or masses. The cervix is smooth, pap with HR HPV genotyping performed.  Uterus is felt to be normal size, shape, and contour.  No adnexal masses or tenderness noted.Bladder is non tender, no masses felt. Extremities/musculoskeletal:  No swelling or varicosities noted, no clubbing or cyanosis Psych:  No mood changes, alert and cooperative,seems happy AA is 1 Fall risk is low    05/10/2023    1:34 PM 12/09/2020   10:38 AM 06/06/2019     3:44 PM  Depression screen PHQ 2/9  Decreased Interest 1 1 0  Down, Depressed, Hopeless 1 1 0  PHQ - 2 Score 2 2 0  Altered sleeping 1 0 0  Tired, decreased energy 2 1 1   Change in appetite 2 1 1   Feeling bad or failure about yourself  1 1 0  Trouble concentrating 1 1 1   Moving slowly or fidgety/restless 1 0 0  Suicidal thoughts 0 0 0  PHQ-9 Score 10 6 3   Difficult doing work/chores   Not difficult at all       05/10/2023    1:35 PM 12/09/2020   10:38 AM 07/30/2019    4:01 PM 06/06/2019    3:42 PM  GAD 7 : Generalized Anxiety Score  Nervous, Anxious, on Edge 2 1 2 2   Control/stop worrying 1 1 0 1  Worry too much - different things 2 1 2 1   Trouble relaxing 1 1 1 1   Restless 1 1 1 1   Easily annoyed or irritable 1 1 1 1   Afraid - awful might happen 1 1 1 1   Total GAD 7 Score 9 7 8 8   Anxiety Difficulty   Somewhat difficult Not difficult at all    Upstream - 05/10/23 1339       Pregnancy Intention Screening   Does the patient want to become pregnant in the next year? No    Does the patient's partner want to become pregnant in the next year? No    Would the patient like  to discuss contraceptive options today? No      Contraception Wrap Up   Current Method Female Sterilization    End Method Female Sterilization    Contraception Counseling Provided No            Examination chaperoned by Malachy Mood LPN     Impression and plan: 1. Encounter for gynecological examination with Papanicolaou smear of cervix Pap sent Pap in 3 years if normal Physical in 1 year Will check labs  - Cytology - PAP( [facility]) - CBC - Comprehensive metabolic panel - Lipid panel  2. Hypothyroidism, unspecified type On synthroid 88 mcg, has refills  - TSH + free T4  3. Dysmenorrhea +bad cramps with period   4. Menorrhagia with regular cycle Periods heavy first 2 days, will change pads every 2 hours  Check CBC to rule anemia  - CBC Gave handout on endometrial ablation, as non  hormonal option to control/stop periods   5. Large breasts  6. Screening cholesterol level - Lipid panel  7. Anxiety and depression She is on Prozac 40 mg 1 daily and has refills she says

## 2023-05-11 ENCOUNTER — Encounter: Payer: Self-pay | Admitting: Adult Health

## 2023-05-11 ENCOUNTER — Other Ambulatory Visit: Payer: Self-pay | Admitting: Adult Health

## 2023-05-11 ENCOUNTER — Telehealth: Payer: Self-pay

## 2023-05-11 DIAGNOSIS — E7800 Pure hypercholesterolemia, unspecified: Secondary | ICD-10-CM | POA: Insufficient documentation

## 2023-05-11 DIAGNOSIS — R748 Abnormal levels of other serum enzymes: Secondary | ICD-10-CM

## 2023-05-11 DIAGNOSIS — R7989 Other specified abnormal findings of blood chemistry: Secondary | ICD-10-CM | POA: Insufficient documentation

## 2023-05-11 LAB — CBC
Hematocrit: 42.5 % (ref 34.0–46.6)
Hemoglobin: 14.1 g/dL (ref 11.1–15.9)
MCH: 30.6 pg (ref 26.6–33.0)
MCHC: 33.2 g/dL (ref 31.5–35.7)
MCV: 92 fL (ref 79–97)
Platelets: 254 10*3/uL (ref 150–450)
RBC: 4.61 x10E6/uL (ref 3.77–5.28)
RDW: 13.1 % (ref 11.7–15.4)
WBC: 5.6 10*3/uL (ref 3.4–10.8)

## 2023-05-11 LAB — COMPREHENSIVE METABOLIC PANEL
ALT: 137 IU/L — ABNORMAL HIGH (ref 0–32)
AST: 59 IU/L — ABNORMAL HIGH (ref 0–40)
Albumin: 4.7 g/dL (ref 3.9–4.9)
Alkaline Phosphatase: 58 IU/L (ref 44–121)
BUN/Creatinine Ratio: 10 (ref 9–23)
BUN: 10 mg/dL (ref 6–20)
Bilirubin Total: 0.6 mg/dL (ref 0.0–1.2)
CO2: 21 mmol/L (ref 20–29)
Calcium: 9.9 mg/dL (ref 8.7–10.2)
Chloride: 104 mmol/L (ref 96–106)
Creatinine, Ser: 0.97 mg/dL (ref 0.57–1.00)
Globulin, Total: 2.6 g/dL (ref 1.5–4.5)
Glucose: 80 mg/dL (ref 70–99)
Potassium: 4.6 mmol/L (ref 3.5–5.2)
Sodium: 140 mmol/L (ref 134–144)
Total Protein: 7.3 g/dL (ref 6.0–8.5)
eGFR: 77 mL/min/{1.73_m2} (ref >59)

## 2023-05-11 LAB — LIPID PANEL
Chol/HDL Ratio: 4.4 ratio (ref 0.0–4.4)
Cholesterol, Total: 293 mg/dL — ABNORMAL HIGH (ref 100–199)
HDL: 67 mg/dL (ref >39)
LDL Chol Calc (NIH): 200 mg/dL — ABNORMAL HIGH (ref 0–99)
Triglycerides: 144 mg/dL (ref 0–149)
VLDL Cholesterol Cal: 26 mg/dL (ref 5–40)

## 2023-05-11 LAB — TSH+FREE T4
Free T4: 1.41 ng/dL (ref 0.82–1.77)
TSH: 5.17 u[IU]/mL — ABNORMAL HIGH (ref 0.450–4.500)

## 2023-05-11 NOTE — Telephone Encounter (Signed)
Patient wants Victorino Dike to give her a call.

## 2023-05-11 NOTE — Progress Notes (Signed)
Recheck LFTS

## 2023-05-11 NOTE — Telephone Encounter (Signed)
Pt had called before she had mychart working at home, will check labs in 2 weeks

## 2023-05-12 LAB — CYTOLOGY - PAP
Comment: NEGATIVE
Diagnosis: NEGATIVE
High risk HPV: NEGATIVE

## 2023-09-24 ENCOUNTER — Other Ambulatory Visit: Payer: Self-pay | Admitting: Adult Health

## 2023-12-04 ENCOUNTER — Other Ambulatory Visit: Payer: Self-pay | Admitting: Adult Health

## 2024-03-07 ENCOUNTER — Other Ambulatory Visit: Payer: Self-pay | Admitting: Adult Health

## 2024-03-07 DIAGNOSIS — E039 Hypothyroidism, unspecified: Secondary | ICD-10-CM

## 2024-03-07 NOTE — Progress Notes (Signed)
Ck TSH and free T4 

## 2024-03-09 LAB — TSH+FREE T4
Free T4: 1.26 ng/dL (ref 0.82–1.77)
TSH: 6.03 u[IU]/mL — ABNORMAL HIGH (ref 0.450–4.500)

## 2024-03-13 ENCOUNTER — Ambulatory Visit: Payer: Self-pay | Admitting: Adult Health

## 2024-03-13 MED ORDER — LEVOTHYROXINE SODIUM 100 MCG PO TABS: 100.0000 ug | ORAL_TABLET | Freq: Every day | ORAL | 3 refills | Status: DC

## 2024-06-25 ENCOUNTER — Other Ambulatory Visit: Payer: Self-pay | Admitting: Adult Health

## 2024-06-25 DIAGNOSIS — E039 Hypothyroidism, unspecified: Secondary | ICD-10-CM

## 2024-06-25 NOTE — Progress Notes (Signed)
 Ck TSH free T4

## 2024-06-28 ENCOUNTER — Other Ambulatory Visit: Payer: Self-pay | Admitting: Adult Health

## 2024-06-28 ENCOUNTER — Ambulatory Visit: Payer: Self-pay | Admitting: Adult Health

## 2024-06-28 LAB — TSH+FREE T4
Free T4: 1.58 ng/dL (ref 0.82–1.77)
TSH: 0.975 u[IU]/mL (ref 0.450–4.500)

## 2024-06-28 MED ORDER — LEVOTHYROXINE SODIUM 100 MCG PO TABS: 100.0000 ug | ORAL_TABLET | Freq: Every day | ORAL | 6 refills | Status: AC

## 2024-06-28 NOTE — Progress Notes (Signed)
 Rx sent to [facility] for synthroid  100 mcg  1 daily ,

## 2024-07-07 ENCOUNTER — Other Ambulatory Visit: Payer: Self-pay | Admitting: Adult Health
